# Patient Record
Sex: Male | Born: 1950 | Race: White | Hispanic: No | Marital: Married | Smoking: Former smoker
Health system: Southern US, Community
[De-identification: ages and names within clinical notes are randomized; demographics above are authoritative.]

## PROBLEM LIST (undated history)

## (undated) DIAGNOSIS — G709 Myoneural disorder, unspecified: Secondary | ICD-10-CM

## (undated) DIAGNOSIS — M2042 Other hammer toe(s) (acquired), left foot: Secondary | ICD-10-CM

## (undated) DIAGNOSIS — E11319 Type 2 diabetes mellitus with unspecified diabetic retinopathy without macular edema: Secondary | ICD-10-CM

## (undated) DIAGNOSIS — I499 Cardiac arrhythmia, unspecified: Secondary | ICD-10-CM

## (undated) DIAGNOSIS — I10 Essential (primary) hypertension: Secondary | ICD-10-CM

## (undated) DIAGNOSIS — E785 Hyperlipidemia, unspecified: Secondary | ICD-10-CM

## (undated) DIAGNOSIS — M1990 Unspecified osteoarthritis, unspecified site: Secondary | ICD-10-CM

## (undated) DIAGNOSIS — M1460 Charcot's joint, unspecified site: Secondary | ICD-10-CM

## (undated) HISTORY — DX: Hyperlipidemia, unspecified: E78.5

## (undated) HISTORY — PX: TONSILLECTOMY AND ADENOIDECTOMY: SUR1326

## (undated) HISTORY — PX: TOE AMPUTATION: SHX809

## (undated) HISTORY — PX: VASECTOMY: SHX75

## (undated) HISTORY — DX: Essential (primary) hypertension: I10

---

## 1993-03-31 DIAGNOSIS — I10 Essential (primary) hypertension: Secondary | ICD-10-CM

## 1993-03-31 HISTORY — DX: Essential (primary) hypertension: I10

## 1997-03-31 HISTORY — PX: DOPPLER ECHOCARDIOGRAPHY: SHX263

## 1997-05-29 DIAGNOSIS — E785 Hyperlipidemia, unspecified: Secondary | ICD-10-CM

## 1997-05-29 HISTORY — DX: Hyperlipidemia, unspecified: E78.5

## 1997-06-20 ENCOUNTER — Encounter: Payer: Self-pay | Admitting: Family Medicine

## 1997-06-20 LAB — CONVERTED CEMR LAB
RBC count: 5.22 10*6/uL
TSH: 0.61 microintl units/mL
WBC, blood: 5.7 10*3/uL

## 1997-11-29 ENCOUNTER — Encounter: Payer: Self-pay | Admitting: Family Medicine

## 1997-12-08 ENCOUNTER — Encounter: Payer: Self-pay | Admitting: Family Medicine

## 1997-12-08 LAB — CONVERTED CEMR LAB
Blood Glucose, Fasting: 126 mg/dL
Hgb A1c MFr Bld: 6 %

## 1997-12-29 HISTORY — PX: OTHER SURGICAL HISTORY: SHX169

## 1998-05-30 HISTORY — PX: OTHER SURGICAL HISTORY: SHX169

## 1998-06-30 HISTORY — PX: OTHER SURGICAL HISTORY: SHX169

## 1999-01-30 ENCOUNTER — Encounter: Payer: Self-pay | Admitting: Family Medicine

## 1999-01-30 LAB — CONVERTED CEMR LAB: Hgb A1c MFr Bld: 10.9 %

## 1999-02-07 ENCOUNTER — Encounter: Payer: Self-pay | Admitting: Family Medicine

## 1999-05-24 ENCOUNTER — Encounter: Payer: Self-pay | Admitting: Family Medicine

## 1999-05-24 LAB — CONVERTED CEMR LAB: Blood Glucose, Fasting: 291 mg/dL

## 1999-09-29 ENCOUNTER — Encounter: Payer: Self-pay | Admitting: Family Medicine

## 1999-09-29 LAB — CONVERTED CEMR LAB: Hgb A1c MFr Bld: 7.5 %

## 1999-09-30 ENCOUNTER — Encounter: Payer: Self-pay | Admitting: Family Medicine

## 2000-03-31 ENCOUNTER — Encounter: Payer: Self-pay | Admitting: Family Medicine

## 2000-04-01 ENCOUNTER — Encounter: Payer: Self-pay | Admitting: Family Medicine

## 2000-08-29 ENCOUNTER — Encounter: Payer: Self-pay | Admitting: Family Medicine

## 2000-08-29 LAB — CONVERTED CEMR LAB: Hgb A1c MFr Bld: 6.9 %

## 2000-09-22 ENCOUNTER — Encounter: Payer: Self-pay | Admitting: Family Medicine

## 2000-09-22 LAB — CONVERTED CEMR LAB
Blood Glucose, Fasting: 165 mg/dL
Microalbumin U total vol: 9.3 mg/L

## 2001-01-11 HISTORY — PX: DOPPLER ECHOCARDIOGRAPHY: SHX263

## 2001-05-29 ENCOUNTER — Encounter: Payer: Self-pay | Admitting: Family Medicine

## 2001-05-29 LAB — CONVERTED CEMR LAB: Microalbumin U total vol: 12.9 mg/L

## 2001-06-01 ENCOUNTER — Encounter: Payer: Self-pay | Admitting: Family Medicine

## 2001-06-01 LAB — CONVERTED CEMR LAB
Hgb A1c MFr Bld: 7.6 %
PSA: 0.2 ng/mL
TSH: 0.75 microintl units/mL

## 2002-03-02 HISTORY — PX: OTHER SURGICAL HISTORY: SHX169

## 2002-03-31 ENCOUNTER — Encounter: Payer: Self-pay | Admitting: Family Medicine

## 2002-03-31 LAB — CONVERTED CEMR LAB: Hgb A1c MFr Bld: 6.4 %

## 2002-04-11 ENCOUNTER — Encounter: Payer: Self-pay | Admitting: Family Medicine

## 2002-04-11 LAB — CONVERTED CEMR LAB
Blood Glucose, Fasting: 161 mg/dL
Hgb A1c MFr Bld: 6.4 %

## 2002-06-20 ENCOUNTER — Encounter: Payer: Self-pay | Admitting: Family Medicine

## 2002-06-20 LAB — CONVERTED CEMR LAB
Hgb A1c MFr Bld: 7.3 %
Microalbumin U total vol: 4.7 mg/L
PSA: 0.2 ng/mL

## 2002-07-01 LAB — FECAL OCCULT BLOOD, GUAIAC: Fecal Occult Blood: NEGATIVE

## 2003-04-01 HISTORY — PX: ATRIAL ABLATION SURGERY: SHX560

## 2004-04-19 ENCOUNTER — Ambulatory Visit: Payer: Self-pay | Admitting: Family Medicine

## 2005-07-23 ENCOUNTER — Ambulatory Visit: Payer: Self-pay | Admitting: Gastroenterology

## 2005-08-05 ENCOUNTER — Encounter (INDEPENDENT_AMBULATORY_CARE_PROVIDER_SITE_OTHER): Payer: Self-pay | Admitting: *Deleted

## 2005-08-05 ENCOUNTER — Ambulatory Visit: Payer: Self-pay | Admitting: Gastroenterology

## 2005-08-05 LAB — HM COLONOSCOPY

## 2005-09-28 ENCOUNTER — Encounter: Payer: Self-pay | Admitting: Family Medicine

## 2005-09-28 LAB — CONVERTED CEMR LAB: Hgb A1c MFr Bld: 8.2 %

## 2005-11-26 ENCOUNTER — Encounter: Payer: Self-pay | Admitting: Family Medicine

## 2005-11-26 HISTORY — PX: DOBUTAMINE STRESS ECHO: SHX5426

## 2005-12-29 ENCOUNTER — Encounter: Payer: Self-pay | Admitting: Family Medicine

## 2005-12-29 LAB — CONVERTED CEMR LAB: Hgb A1c MFr Bld: 7.8 %

## 2006-03-31 ENCOUNTER — Encounter: Payer: Self-pay | Admitting: Family Medicine

## 2006-03-31 LAB — CONVERTED CEMR LAB: Hgb A1c MFr Bld: 8.8 %

## 2006-06-30 ENCOUNTER — Encounter: Payer: Self-pay | Admitting: Family Medicine

## 2007-03-03 ENCOUNTER — Ambulatory Visit: Payer: Self-pay | Admitting: Family Medicine

## 2007-03-05 ENCOUNTER — Encounter: Payer: Self-pay | Admitting: Family Medicine

## 2007-03-05 DIAGNOSIS — I4891 Unspecified atrial fibrillation: Secondary | ICD-10-CM | POA: Insufficient documentation

## 2007-03-08 DIAGNOSIS — E785 Hyperlipidemia, unspecified: Secondary | ICD-10-CM | POA: Insufficient documentation

## 2007-03-08 DIAGNOSIS — I10 Essential (primary) hypertension: Secondary | ICD-10-CM | POA: Insufficient documentation

## 2007-03-08 DIAGNOSIS — E119 Type 2 diabetes mellitus without complications: Secondary | ICD-10-CM | POA: Insufficient documentation

## 2007-04-17 ENCOUNTER — Encounter: Payer: Self-pay | Admitting: Family Medicine

## 2007-06-09 ENCOUNTER — Ambulatory Visit: Payer: Self-pay | Admitting: Family Medicine

## 2007-06-15 ENCOUNTER — Ambulatory Visit: Payer: Self-pay | Admitting: Family Medicine

## 2007-06-15 DIAGNOSIS — M25569 Pain in unspecified knee: Secondary | ICD-10-CM | POA: Insufficient documentation

## 2007-06-23 ENCOUNTER — Encounter: Payer: Self-pay | Admitting: Family Medicine

## 2007-07-07 ENCOUNTER — Ambulatory Visit: Payer: Self-pay | Admitting: Family Medicine

## 2007-07-07 LAB — CONVERTED CEMR LAB
AST: 19 units/L (ref 0–37)
Alkaline Phosphatase: 60 units/L (ref 39–117)
Basophils Absolute: 0 10*3/uL (ref 0.0–0.1)
Bilirubin, Direct: 0.1 mg/dL (ref 0.0–0.3)
CO2: 29 meq/L (ref 19–32)
Chloride: 107 meq/L (ref 96–112)
Creatinine,U: 110.4 mg/dL
Direct LDL: 82.1 mg/dL
Eosinophils Absolute: 0.2 10*3/uL (ref 0.0–0.7)
GFR calc non Af Amer: 124 mL/min
HDL: 25.7 mg/dL — ABNORMAL LOW (ref >39.0)
Hgb A1c MFr Bld: 9.5 % — ABNORMAL HIGH (ref 4.6–6.0)
Lymphocytes Relative: 40.9 % (ref 12.0–46.0)
MCHC: 34 g/dL (ref 30.0–36.0)
MCV: 84.9 fL (ref 78.0–100.0)
Neutrophils Relative %: 48.8 % (ref 43.0–77.0)
PSA: 0.18 ng/mL (ref 0.10–4.00)
Platelets: 190 10*3/uL (ref 150–400)
Potassium: 4.1 meq/L (ref 3.5–5.1)
TSH: 0.51 microintl units/mL (ref 0.35–5.50)
Total Bilirubin: 0.9 mg/dL (ref 0.3–1.2)
Triglycerides: 201 mg/dL (ref 0–149)
VLDL: 40 mg/dL (ref 0–40)
WBC: 5.4 10*3/uL (ref 4.5–10.5)

## 2007-07-14 ENCOUNTER — Ambulatory Visit: Payer: Self-pay | Admitting: Family Medicine

## 2007-10-13 ENCOUNTER — Ambulatory Visit: Payer: Self-pay | Admitting: Family Medicine

## 2007-10-13 LAB — CONVERTED CEMR LAB: Hgb A1c MFr Bld: 9.6 % — ABNORMAL HIGH (ref 4.6–6.0)

## 2007-10-27 ENCOUNTER — Ambulatory Visit: Payer: Self-pay | Admitting: Family Medicine

## 2007-10-27 DIAGNOSIS — F528 Other sexual dysfunction not due to a substance or known physiological condition: Secondary | ICD-10-CM | POA: Insufficient documentation

## 2007-11-24 ENCOUNTER — Ambulatory Visit: Payer: Self-pay | Admitting: Family Medicine

## 2007-12-01 ENCOUNTER — Ambulatory Visit: Payer: Self-pay | Admitting: Family Medicine

## 2008-01-17 ENCOUNTER — Telehealth: Payer: Self-pay | Admitting: Family Medicine

## 2009-01-24 ENCOUNTER — Ambulatory Visit: Payer: Self-pay | Admitting: Family Medicine

## 2009-01-24 LAB — CONVERTED CEMR LAB
ALT: 19 units/L (ref 0–53)
BUN: 11 mg/dL (ref 6–23)
Basophils Absolute: 0 10*3/uL (ref 0.0–0.1)
Calcium: 8.8 mg/dL (ref 8.4–10.5)
Chloride: 100 meq/L (ref 96–112)
Cholesterol: 168 mg/dL (ref 0–200)
Creatinine, Ser: 0.7 mg/dL (ref 0.4–1.5)
HCT: 44 % (ref 39.0–52.0)
Lymphs Abs: 2.7 10*3/uL (ref 0.7–4.0)
MCV: 89.1 fL (ref 78.0–100.0)
Monocytes Absolute: 0.5 10*3/uL (ref 0.1–1.0)
Monocytes Relative: 7.2 % (ref 3.0–12.0)
PSA: 0.28 ng/mL (ref 0.10–4.00)
Platelets: 176 10*3/uL (ref 150.0–400.0)
RDW: 13.6 % (ref 11.5–14.6)
Total Bilirubin: 1.1 mg/dL (ref 0.3–1.2)
Total CHOL/HDL Ratio: 6
Triglycerides: 281 mg/dL — ABNORMAL HIGH (ref 0.0–149.0)

## 2009-01-31 ENCOUNTER — Ambulatory Visit: Payer: Self-pay | Admitting: Family Medicine

## 2009-01-31 DIAGNOSIS — E559 Vitamin D deficiency, unspecified: Secondary | ICD-10-CM | POA: Insufficient documentation

## 2009-11-01 ENCOUNTER — Encounter (INDEPENDENT_AMBULATORY_CARE_PROVIDER_SITE_OTHER): Payer: Self-pay | Admitting: *Deleted

## 2010-01-25 ENCOUNTER — Ambulatory Visit: Payer: Self-pay | Admitting: Family Medicine

## 2010-01-25 DIAGNOSIS — Q742 Other congenital malformations of lower limb(s), including pelvic girdle: Secondary | ICD-10-CM | POA: Insufficient documentation

## 2010-01-25 DIAGNOSIS — L0390 Cellulitis, unspecified: Secondary | ICD-10-CM

## 2010-01-25 DIAGNOSIS — L0291 Cutaneous abscess, unspecified: Secondary | ICD-10-CM | POA: Insufficient documentation

## 2010-01-28 ENCOUNTER — Telehealth: Payer: Self-pay | Admitting: Family Medicine

## 2010-02-08 ENCOUNTER — Ambulatory Visit: Payer: Self-pay | Admitting: Family Medicine

## 2010-02-08 DIAGNOSIS — IMO0002 Reserved for concepts with insufficient information to code with codable children: Secondary | ICD-10-CM | POA: Insufficient documentation

## 2010-02-20 ENCOUNTER — Telehealth (INDEPENDENT_AMBULATORY_CARE_PROVIDER_SITE_OTHER): Payer: Self-pay | Admitting: *Deleted

## 2010-02-27 ENCOUNTER — Ambulatory Visit: Payer: Self-pay | Admitting: Family Medicine

## 2010-02-27 LAB — CONVERTED CEMR LAB
ALT: 15 units/L (ref 0–53)
Alkaline Phosphatase: 61 units/L (ref 39–117)
Bilirubin, Direct: 0.1 mg/dL (ref 0.0–0.3)
CO2: 28 meq/L (ref 19–32)
Chloride: 101 meq/L (ref 96–112)
Creatinine,U: 99.1 mg/dL
Hgb A1c MFr Bld: 10.8 % — ABNORMAL HIGH (ref 4.6–6.5)
Microalb Creat Ratio: 1.6 mg/g (ref 0.0–30.0)
Potassium: 4.3 meq/L (ref 3.5–5.1)
Sodium: 138 meq/L (ref 135–145)
TSH: 1.21 microintl units/mL (ref 0.35–5.50)
Total CHOL/HDL Ratio: 5
Total Protein: 6.3 g/dL (ref 6.0–8.3)

## 2010-03-01 ENCOUNTER — Ambulatory Visit: Payer: Self-pay | Admitting: Family Medicine

## 2010-04-30 NOTE — Assessment & Plan Note (Signed)
Summary: hole from abcess   Vital Signs:  Patient profile:   60 year old male Height:      73.5 inches Weight:      233.75 pounds BMI:     30.53 Temp:     98 degrees F oral Pulse rate:   100 / minute Pulse rhythm:   regular BP sitting:   168 / 74  (left arm) Cuff size:   large  Vitals Entered By: Delilah Shan CMA Duncan Dull) (February 08, 2010 3:01 PM) CC: Hole from abscess that drained (left armpit)   History of Present Illness: Prev absess drained, now with residual hole on L axilla.  Occ drainage.  has been washing it daily and filling it with neosporin.  No fevers.  minimal erythema per patient.   Allergies: 1)  ! Actos (Pioglitazone Hcl) 2)  ! Avandia (Rosiglitazone Maleate)  Review of Systems       See HPI.  Otherwise negative.    Physical Exam  General:  NAD 1cm hole that probes 2cm deep.  minimal erythema. no other fluctuant mass.  packed with iodoform gauze and tolerated well.    Impression & Recommendations:  Problem # 1:  ABSCESS, AXILLA, LEFT (ICD-682.3) Packed today and see instructions.  follow up as needed . he agrees.  His updated medication list for this problem includes:    Doxycycline Hyclate 100 Mg Caps (Doxycycline hyclate) .Marland Kitchen... 1 by mouth two times a day  Complete Medication List: 1)  Metformin Hcl 1000 Mg Tabs (Metformin hcl) .Marland Kitchen.. 1 tablet by mouth twice a day 2)  Coreg 12.5 Mg Tabs (Carvedilol) .Marland Kitchen.. 1tablet by mouth twice a day 3)  Glyburide 5 Mg Tabs (Glyburide) .... Take 2 by mouth two times a day 4)  Doxycycline Hyclate 100 Mg Caps (Doxycycline hyclate) .Marland Kitchen.. 1 by mouth two times a day 5)  Humulin N 100 Unit/ml Susp (Insulin isophane human) .Marland Kitchen.. 10 units qpm 6)  Humulin R 100 Unit/ml Soln (Insulin regular human) .Marland Kitchen.. 15 units qpm  Patient Instructions: 1)  I would pull about 1/4" of the packing each day and trim it.  Leave a tail to work with.  It should gradually heal from the bottom up and should keep draining in the meantime.  Let me know  if the redness gets worse or if it hurts more.  Don't wear deoderant.  Take care.    Orders Added: 1)  Est. Patient Level III [16109]    Current Allergies (reviewed today): ! ACTOS (PIOGLITAZONE HCL) ! AVANDIA (ROSIGLITAZONE MALEATE)

## 2010-04-30 NOTE — Progress Notes (Signed)
Summary: not any better  Phone Note Call from Patient Call back at Home Phone [phone]   Caller: Patient Call For: Dr. Para March Summary of Call: Pt was seen on friday for abscesses under left arm.  He was told to call back if these were not better, he says they are not, they are worse- draining. He is taking abx.  Please advise on what he should do. Initial call taken by: Lowella Petties CMA, AAMA,  January 28, 2010 3:46 PM  Follow-up for Phone Call        "They feel some better."  They got bigger on Sunday and then started draining.  Both are draining and smaller now. No fevers, no spreading erythema.  I talked w/pt about options.  I would cont warm compresses for now and let us know if size increases.  I would cont the antibiotics.  follow up as needed.  he doesn't sound like he needs recheck now. He agrees with the plan.  Follow-up by: Crawford Givens MD,  January 28, 2010 4:52 PM

## 2010-04-30 NOTE — Progress Notes (Signed)
----   Converted from flag ---- ---- 02/19/2010 2:00 PM, Crawford Givens MD wrote: TSH 427.31 cmet/lipid/a1c/malb 250.00 vit d 268.9 psa v76.44  ---- 02/19/2010 1:29 PM, Liane Comber CMA (AAMA) wrote: Lab orders please! Good Morning! This pt is scheduled for cpx labs Wed, which labs to draw and dx codes to use? Thanks Tasha ------------------------------

## 2010-04-30 NOTE — Assessment & Plan Note (Signed)
Summary: ?RASH UNDER ARMS/CLE   Vital Signs:  Patient profile:   60 year old male Height:      73.5 inches Weight:      233.50 pounds BMI:     30.50 Temp:     98.4 degrees F oral Pulse rate:   96 / minute Pulse rhythm:   regular BP sitting:   160 / 90  (right arm) Cuff size:   large  Vitals Entered By: Delilah Shan CMA Duncan Dull) (January 25, 2010 4:01 PM) CC: Rash / ? infection under left arm.   Check toe.   History of Present Illness: Rash- 1 week ago, chapped under the L arm.  Since then increase in pain and "knots" under the L arm.  H/o similar in the distant past.  No fevers.  Fasting glucose  ~150 per patient.   R second toe- hammertoe, wearing a hammertoe pad.    H/o DM and h/o neuropathy.   Due for CPE and labs.    Allergies: 1)  ! Actos (Pioglitazone Hcl) 2)  ! Avandia (Rosiglitazone Maleate)  Past History:  Past Medical History: Last updated: 03/05/2007 Diabetes mellitus, type UX:(3244) Hyperlipidemia:(05/1997) Hypertension:((1995)  Past Surgical History: STRESS CARDIOLITE NORMAL:(12/1997) ECHO NORMAL EF 60%:(/1999) ETT OK:(05/1998) ETT NORMAL:(06/1998) COLONOSCOPY, DIVERTICULOSIS:(10/1999) ECHO ,MILD CMC, LVH, TR MR,TR:(ELAM):(01/11/2001) STRESS CARDIOLITE WNL EF 70%:(12/03/23003) COLONOSCOPY POLYPS DIVERTYICS (DR STARK) (08/05/2005) HOSP [facility] R/O'D 11/18/2005 STRESS ECHO NONSUST VTACH HTSVE ECG NML 11/26/05 Ablation for AFib 2005  Family History: Reviewed history from 01/31/2009 and no changes required. Father: DECEASED 40 STROKE , LEUKEMIA Mother: ALIVE BROTHER A 25 SISTER A   SISTER A SISTER dec  COLON CANCER CV:+GF MI X MULTIPLE, AUNT MI HBP: + SELF DM: + SELF MGM, MOTHERS SIBLINGS GOUT/ARTHRITIS: PROSTATE CANCER NEGATIVE// + LEUKEMIA FATHER BREAST/OVARIAN/UTERINE CANCER: COLON CANCER: + SISTER DEPRESSION: NEGATIVE ETOH/DRUG ABUSE : NEGATIVE OTHER + STROKE GF90'S  Social History: Marital Status: Married LIVES WITH WIFE Children: 4, out  of home Occupation: Programmer, applications, sells [employer] father in law lives with patient  Review of Systems       See HPI.  Otherwise negative.    Physical Exam  General:  NAD L axilla with thickening of the skin in 2 areas that is mildly tender to palpation.  No fluctuant mass.  No discharge.  No epithelial disruption.   R 2nd hammertoe noted. See foot exam.  2+ DP/PT pulses bilaterally  Diabetes Management Exam:    Foot Exam (with socks and/or shoes not present):       Sensory-Pinprick/Light touch:          Left medial foot (L-4): diminished          Left dorsal foot (L-5): normal          Left lateral foot (S-1): diminished          Right medial foot (L-4): diminished          Right dorsal foot (L-5): normal          Right lateral foot (S-1): diminished       Sensory-Monofilament:          Left foot: normal          Right foot: normal       Sensory-other: mild decrease in sensation on bottom of foot bilaterally        Inspection:          Left foot: normal          Right foot: abnormal  Comments: 2nd toe= hammertoe and irritation but w/o ulceration.  the nail had grown down on the front of the toe, but this has been trimmed back.        Nails:          Left foot: normal          Right foot: see above    Impression & Recommendations:  Problem # 1:  CELLULITIS (ICD-682.9) No indication for I&D.  Start antibiotics and avoid deoderant.  Nontoxic and okay for outpatient follow up.  follow up if not improving.  His updated medication list for this problem includes:    Doxycycline Hyclate 100 Mg Caps (Doxycycline hyclate) .Marland Kitchen... 1 by mouth two times a day  Problem # 2:  HAMMER TOE (ICD-755.66) Pt to call podiatry clinic for follow up.  No acute change that would need tx now.  D/w patient re:DM2 care and follow up.  He understands.   Complete Medication List: 1)  Metformin Hcl 1000 Mg Tabs (Metformin hcl) .Marland Kitchen.. 1 tablet by mouth twice a day 2)  Coreg 12.5 Mg Tabs  (Carvedilol) .Marland Kitchen.. 1tablet by mouth twice a day 3)  Glyburide 5 Mg Tabs (Glyburide) .... Take 2 by mouth two times a day 4)  Doxycycline Hyclate 100 Mg Caps (Doxycycline hyclate) .Marland Kitchen.. 1 by mouth two times a day 5)  Humulin N 100 Unit/ml Susp (Insulin isophane human) .Marland Kitchen.. 10 units qpm 6)  Humulin R 100 Unit/ml Soln (Insulin regular human) .Marland Kitchen.. 15 units qpm  Patient Instructions: 1)  Take the antibiotics two times a day and let me know if you aren't getting better.  Call the podiatry clinic and schedule an appointment with them about your toe. 2)  Come back for fasting labs.  3)  cmet/lipid/A1c/MALB 250.00 4)  PSA v76.49 5)  Get a physical set up for a few days after your labs.  Prescriptions: DOXYCYCLINE HYCLATE 100 MG CAPS (DOXYCYCLINE HYCLATE) 1 by mouth two times a day  #20 x 0   Entered and Authorized by:   Crawford Givens MD   Signed by:   Crawford Givens MD on 01/25/2010   Method used:   Electronically to        [facility] [phone]* (retail)       [address]       [place], [place]  [address]       Ph: [phone]       Fax: [phone]   RxID:   [id number]    Orders Added: 1)  Est. Patient Level III [24401]

## 2010-04-30 NOTE — Letter (Signed)
Summary: Nadara Eaton letter  [facility]  [address] [place], [place] [address]   Phone: [phone]  Fax: [phone]       11/01/2009 MRN: [id number]  Dillon Henry [address] [place], [place]  [address]  Dear Mr. [name] [place] [facility], and [facility] announce the retirement of Arta Silence, M.D., from full-time practice at the [facility] office effective September 27, 2009 and his plans of returning part-time.  It is important to Dr. Hetty Ely and to our practice that you understand that [facility] has seven physicians in our office for your health care needs.  We will continue to offer the same exceptional care that you have today.    Dr. Hetty Ely has spoken to many of you about his plans for retirement and returning part-time in the fall.   We will continue to work with you through the transition to schedule appointments for you in the office and meet the high standards that [place] is committed to.   Again, it is with great pleasure that we share the news that Dr. Hetty Ely will return to [facility] at [facility] in October of 2011 with a reduced schedule.    If you have any questions, or would like to request an appointment with one of our physicians, please call us at [phone] and press the option for Scheduling an appointment.  We take pleasure in providing you with excellent patient care and look forward to seeing you at your next office visit.  Our [facility] Physicians are:  Tillman Abide, M.D. Laurita Quint, M.D. Roxy Manns, M.D. Kerby Nora, M.D. Hannah Beat, M.D. Ruthe Mannan, M.D. We proudly welcomed Raechel Ache, M.D. and Eustaquio Boyden, M.D. to the practice in July/August 2011.  Sincerely,  [facility]

## 2010-04-30 NOTE — Assessment & Plan Note (Signed)
Summary: CPX/DLO   Vital Signs:  Patient profile:   60 year old male Height:      73.5 inches Weight:      233.75 pounds BMI:     30.53 Temp:     98.4 degrees F oral Pulse rate:   88 / minute Pulse rhythm:   regular BP sitting:   150 / 94  (left arm) Cuff size:   large  Vitals Entered By: Delilah Shan CMA Duncan Dull) (March 01, 2010 2:36 PM) CC: CPX   History of Present Illness: Here for CPE.  All items deferred due to DM2.   All labs d/w patient.    Eats 1 big meal a day, later in the day.  Snacks during the day.  Checking sugar rarely,  ~1/week.    Prev was on levemir but had trouble getting rx for enough insulin to carry 90days.    Feeling well and L axillary lesion healing.   Allergies: 1)  ! Actos (Pioglitazone Hcl) 2)  ! Avandia (Rosiglitazone Maleate)  Review of Systems       See HPI.  Otherwise negative.    Physical Exam  General:  NAD remainder deferred- see plan.    Impression & Recommendations:  Problem # 1:  DIABETES MELLITUS, TYPE II (ICD-250.00) >45 min spent with patient, at least half of which was spent on counseling re:dx and plan.  This was a long and detailed conversation about options.  He needs better control and we discussed long term problems from uncontrolled DM.  Will start levemir and titrate the AM dose based on sugars.  If A1c still elevated, will need short acting insulin with the evening meal.  Stop glyburide if low glucose later in the day.  He understands.  See instructions.  The following medications were removed from the medication list:    Humulin N 100 Unit/ml Susp (Insulin isophane human) .Marland KitchenMarland KitchenMarland KitchenMarland Kitchen 10 units each evening    Humulin R 100 Unit/ml Soln (Insulin regular human) .Marland KitchenMarland KitchenMarland KitchenMarland Kitchen 15 units each evening His updated medication list for this problem includes:    Metformin Hcl 1000 Mg Tabs (Metformin hcl) .Marland Kitchen... 1 tablet by mouth twice a day    Glyburide 5 Mg Tabs (Glyburide) .Marland Kitchen... Take 2 by mouth two times a day    Levemir 100 Unit/ml  Soln (Insulin detemir) .Marland Kitchen... 20 units injected each am. if am sugar >120, add 1 unit. if <100, subtract 1 unit, if 101-119 then no change in dose  Complete Medication List: 1)  Metformin Hcl 1000 Mg Tabs (Metformin hcl) .Marland Kitchen.. 1 tablet by mouth twice a day 2)  Coreg 12.5 Mg Tabs (Carvedilol) .Marland Kitchen.. 1tablet by mouth twice a day 3)  Glyburide 5 Mg Tabs (Glyburide) .... Take 2 by mouth two times a day 4)  Levemir 100 Unit/ml Soln (Insulin detemir) .... 20 units injected each am. if am sugar >120, add 1 unit. if <100, subtract 1 unit, if 101-119 then no change in dose  Patient Instructions: 1)  Get the levemir and use as follows: if AM sugar >120, increase by 1 unit.  If <100, decrease by 1 unit.  If 101-119, no change in dose.  Let me know how you are doing on the new medicine.  If you have lower sugars later in the day, decrease or stop the glyburide.  Take care.   2)  I want to recheck your A1c in 3-4 months.  250.00 Prescriptions: LEVEMIR 100 UNIT/ML SOLN (INSULIN DETEMIR) 20 units injected each AM.  if AM sugar >120, add 1 unit. if <100, subtract 1 unit, if 101-119 then no change in dose  #3 vials x 3   Entered and Authorized by:   Crawford Givens MD   Signed by:   Crawford Givens MD on 03/01/2010   Method used:   Faxed to ...       [facility] (mail-order)             , [place]         Ph: [phone]       Fax: [phone]   RxID:   [id number]    Orders Added: 1)  Est. Patient Level V [84166]    Current Allergies (reviewed today): ! ACTOS (PIOGLITAZONE HCL) ! AVANDIA (ROSIGLITAZONE MALEATE)

## 2010-05-08 ENCOUNTER — Telehealth: Payer: Self-pay | Admitting: Family Medicine

## 2010-05-16 NOTE — Progress Notes (Signed)
Summary: [facility] Rx's  Phone Note Call from Patient Call back at Home Phone [phone]   Caller: Patient Call For: Dillon Givens MD Summary of Call: Patient says you put him on Levemir and instructed him on how to increase his dosage as needed.  A Rx. was sent to [facility] for him but with his necessary increases, he is about to run out and it has only been 2 months.  He is now taking 70 units a day.  He would like a new Rx. sent to [facility] for a 90 day supply at 70 units per day.  He also says [facility] has been soliciting him for Rx's and he wants his Metformin, Glyburide and Carvedilol sent there also with 90 day supplies and RF's. Initial call taken by: Delilah Shan CMA Duncan Dull),  May 08, 2010 5:39 PM  Follow-up for Phone Call        I updated the insulin dose and faxed them all.  thanks. Dillon Givens MD  May 09, 2010 11:37 AM.  Follow-up by: Dillon Givens MD,  May 09, 2010 11:37 AM    New/Updated Medications: LEVEMIR 100 UNIT/ML SOLN (INSULIN DETEMIR) 70 units injected each AM. if AM sugar >120, add 1 unit. if <100, subtract 1 unit, if 101-119 then no change in dose Prescriptions: GLYBURIDE 5 MG TABS (GLYBURIDE) Take 2 by mouth two times a day  #360 Each x 3   Entered and Authorized by:   Dillon Givens MD   Signed by:   Dillon Givens MD on 05/09/2010   Method used:   Faxed to ...       [facility] (mail-order)             , [place]         Ph: [phone]       Fax: [phone]   RxID:   [id number] COREG 12.5 MG  TABS (CARVEDILOL) 1TABLET BY MOUTH TWICE A DAY  #180 Each x 3   Entered and Authorized by:   Dillon Givens MD   Signed by:   Dillon Givens MD on 05/09/2010   Method used:   Faxed to ...       [facility] (mail-order)             , [place]         Ph: [phone]       Fax: [phone]   RxID:   [id number] METFORMIN HCL 1000 MG  TABS (METFORMIN HCL) 1 TABLET BY MOUTH TWICE A DAY  #180 Each x 3   Entered and Authorized by:   Dillon Givens MD   Signed by:   Dillon Givens MD on 05/09/2010   Method used:   Faxed to ...       [facility] (mail-order)             , [place]         Ph: [phone]       Fax: [phone]   RxID:   [id number] LEVEMIR 100 UNIT/ML SOLN (INSULIN DETEMIR) 70 units injected each AM. if AM sugar >120, add 1 unit. if <100, subtract 1 unit, if 101-119 then no change in dose  #8 vials x 3   Entered and Authorized by:   Dillon Givens MD   Signed by:   Dillon Givens MD on 05/09/2010   Method used:   Faxed to ...       [facility] (mail-order)             ,  [place] Henry         Ph: [phone]       Fax: [phone]   RxID:   [id number] LEVEMIR 100 UNIT/ML SOLN (INSULIN DETEMIR) 20 units injected each AM. if AM sugar >120, add 1 unit. if <100, subtract 1 unit, if 101-119 then no change in dose  #8 vials x 3   Entered and Authorized by:   Dillon Givens MD   Signed by:   Dillon Givens MD on 05/09/2010   Method used:   Historical   RxID:   [id number] GLYBURIDE 5 MG TABS (GLYBURIDE) Take 2 by mouth two times a day  #360 Each x 3   Entered and Authorized by:   Dillon Givens MD   Signed by:   Dillon Givens MD on 05/09/2010   Method used:   Historical   RxID:   [id number] COREG 12.5 MG  TABS (CARVEDILOL) 1TABLET BY MOUTH TWICE A DAY  #180 Each x 3   Entered and Authorized by:   Dillon Givens MD   Signed by:   Dillon Givens MD on 05/09/2010   Method used:   Historical   RxID:   [id number] METFORMIN HCL 1000 MG  TABS (METFORMIN HCL) 1 TABLET BY MOUTH TWICE A DAY  #180 Each x 3   Entered and Authorized by:   Dillon Givens MD   Signed by:   Dillon Givens MD on 05/09/2010   Method used:   Historical   RxID:   [id number]

## 2010-11-19 ENCOUNTER — Encounter: Payer: Self-pay | Admitting: Family Medicine

## 2010-11-20 ENCOUNTER — Ambulatory Visit: Payer: Self-pay | Admitting: Family Medicine

## 2010-11-21 ENCOUNTER — Ambulatory Visit (INDEPENDENT_AMBULATORY_CARE_PROVIDER_SITE_OTHER): Payer: BC Managed Care – PPO | Admitting: Family Medicine

## 2010-11-21 ENCOUNTER — Encounter: Payer: Self-pay | Admitting: Family Medicine

## 2010-11-21 DIAGNOSIS — I4891 Unspecified atrial fibrillation: Secondary | ICD-10-CM

## 2010-11-21 DIAGNOSIS — E119 Type 2 diabetes mellitus without complications: Secondary | ICD-10-CM

## 2010-11-21 DIAGNOSIS — Q742 Other congenital malformations of lower limb(s), including pelvic girdle: Secondary | ICD-10-CM

## 2010-11-21 NOTE — Patient Instructions (Addendum)
We'll contact you with your lab report and I'll send my note to the foot clinic.  I'll need to talk to cardiology in the meantime.  Take care.

## 2010-11-21 NOTE — Progress Notes (Signed)
"  Surgical clearance".  R foot surgery planned for 11/28/10.  Dillon Henry, [facility] [phone].  R 2nd hammertoe  H/o DM2.  Last seen late 2012.  No f/u since then.  He couldn't get strips recently and hasn't been able to check sugar for a month.  Last A1c was >10.  H/o AF s/p ablation.  No CP, sob, occ minimal BLE edema at night (resolved by AM).  No H/o MI.  No angioplasty, no stent and no CABG.    Can walk 2-3 miles a day w/o CP/SOB, can climb stairs w/o SOB/BLE.  The foot pain is limiting his mobility, not any known cardio/pulmonary process.  No tachy/palpitations.    PMH and SH reviewed  ROS: See HPI, otherwise noncontributory.  Meds, vitals, and allergies reviewed.   GEN: nad, alert and oriented HEENT: mucous membranes moist NECK: supple w/o LA CV: rrr.  PULM: ctab, no inc wob ABD: soft, +bs EXT: no edema SKIN: no acute rash but R second toe has chronic irritation/callus noted.   Diabetic foot exam: Normal inspection No skin breakdown Callus on R 2nd toe note Normal DP pulses Normal sensation to light touch

## 2010-11-22 ENCOUNTER — Encounter: Payer: Self-pay | Admitting: Family Medicine

## 2010-11-22 ENCOUNTER — Telehealth: Payer: Self-pay | Admitting: Family Medicine

## 2010-11-22 LAB — COMPREHENSIVE METABOLIC PANEL
Albumin: 4.2 g/dL (ref 3.5–5.2)
Alkaline Phosphatase: 61 U/L (ref 39–117)
BUN: 12 mg/dL (ref 6–23)
Calcium: 9 mg/dL (ref 8.4–10.5)
Chloride: 100 mEq/L (ref 96–112)
Glucose, Bld: 232 mg/dL — ABNORMAL HIGH (ref 70–99)
Potassium: 4.5 mEq/L (ref 3.5–5.1)

## 2010-11-22 LAB — CBC WITH DIFFERENTIAL/PLATELET
Basophils Relative: 0.4 % (ref 0.0–3.0)
Eosinophils Relative: 2.2 % (ref 0.0–5.0)
Hemoglobin: 14.4 g/dL (ref 13.0–17.0)
MCV: 88.7 fl (ref 78.0–100.0)
Monocytes Absolute: 0.5 10*3/uL (ref 0.1–1.0)
Neutrophils Relative %: 51.8 % (ref 43.0–77.0)
RBC: 4.8 Mil/uL (ref 4.22–5.81)
WBC: 7.7 10*3/uL (ref 4.5–10.5)

## 2010-11-22 LAB — HEMOGLOBIN A1C: Hgb A1c MFr Bld: 10 % — ABNORMAL HIGH (ref 4.6–6.5)

## 2010-11-22 NOTE — Assessment & Plan Note (Signed)
Prev uncontrolled, needs A1c.  See above.  >25 min spent with face to face with patient, >50% counseling.  He is aware of long-term complications of uncontrolled DM2.

## 2010-11-22 NOTE — Telephone Encounter (Signed)
LMOVM at patient's home phone number (permission granted).  Phoned and Faxed note to Dr. Orland Jarred, Podiatry.  Spoke with Debbie.

## 2010-11-22 NOTE — Telephone Encounter (Signed)
Please call pt.  I have reviewed his EKG and it is okay.  However, A1c is still greatly elevated.  He has uncontrolled DM and I cannot recommend him having surgery until this is controlled.  He needs to get blood sugar checked before meals and then 2 hours after meals (6 checks a day) for several days and then send the readings into the clinic for me to review.    Please notify foot clinic that in my opinion pt isn't appropriate for surgery.  See hard copy for contact info.   Thanks.

## 2010-11-22 NOTE — Assessment & Plan Note (Signed)
I told him that I can't "clear" him for surgery, but I can assess if he is acceptably low risk for surgery.  At this point, I need more information.  I'll notify pt when more data are available.

## 2010-11-22 NOTE — Assessment & Plan Note (Signed)
S/p ablation.  EKG reviewed, but I'll need to talk with cards before he proceeds with surgery.

## 2010-11-29 ENCOUNTER — Telehealth: Payer: Self-pay | Admitting: *Deleted

## 2010-11-29 DIAGNOSIS — E119 Type 2 diabetes mellitus without complications: Secondary | ICD-10-CM

## 2010-11-29 NOTE — Telephone Encounter (Signed)
Patient brought in some Blood sugar readings for review.  List in your in box.

## 2010-11-29 NOTE — Telephone Encounter (Signed)
Patient advised.

## 2010-11-29 NOTE — Telephone Encounter (Signed)
Instructions given to patient.  Lab appt and CPE scheduled in 3 months.  Patient is asking if you would fill out his paper for the surgery now?

## 2010-11-29 NOTE — Telephone Encounter (Signed)
LMOVM to return call for instructions and making appts.

## 2010-11-29 NOTE — Telephone Encounter (Signed)
Call pt.  His sugar levels as documented are okay for now.  I would titrate as follows:  If morning sugar is >120, add 1 unit to that days doses. If <100, subtract 1 unit, if 101-119 then no change in dose. Recheck A1c in 3 months with OV a few days later.   I don't know why his A1c was so high (I asked lab and the calibration has been consistent).  It could have been missed doses, diet changes.  Either way, I would proceed as is and see if the A1c drifted downward to a stable level.

## 2010-11-29 NOTE — Telephone Encounter (Signed)
No, I need evidence of sustained blood sugar control before I can fill it out.

## 2010-12-19 ENCOUNTER — Ambulatory Visit: Payer: Self-pay | Admitting: Podiatry

## 2011-02-17 ENCOUNTER — Other Ambulatory Visit: Payer: BC Managed Care – PPO

## 2011-02-24 ENCOUNTER — Other Ambulatory Visit: Payer: Self-pay | Admitting: Gastroenterology

## 2011-03-04 ENCOUNTER — Encounter: Payer: BC Managed Care – PPO | Admitting: Family Medicine

## 2011-03-10 ENCOUNTER — Ambulatory Visit: Payer: Self-pay | Admitting: Gastroenterology

## 2011-03-13 LAB — PATHOLOGY REPORT

## 2015-01-31 ENCOUNTER — Other Ambulatory Visit
Admission: RE | Admit: 2015-01-31 | Discharge: 2015-01-31 | Disposition: A | Discharge status: Home / Self Care | Payer: BLUE CROSS/BLUE SHIELD | Source: Ambulatory Visit | Attending: Podiatry | Admitting: Podiatry

## 2015-01-31 DIAGNOSIS — L0390 Cellulitis, unspecified: Secondary | ICD-10-CM | POA: Diagnosis present

## 2015-02-02 LAB — ANAEROBIC CULTURE

## 2015-02-02 LAB — WOUND CULTURE

## 2016-07-25 ENCOUNTER — Encounter (INDEPENDENT_AMBULATORY_CARE_PROVIDER_SITE_OTHER): Payer: Self-pay | Admitting: Ophthalmology

## 2016-07-30 ENCOUNTER — Encounter (INDEPENDENT_AMBULATORY_CARE_PROVIDER_SITE_OTHER): Payer: BLUE CROSS/BLUE SHIELD | Admitting: Ophthalmology

## 2016-07-30 DIAGNOSIS — E113512 Type 2 diabetes mellitus with proliferative diabetic retinopathy with macular edema, left eye: Secondary | ICD-10-CM | POA: Diagnosis not present

## 2016-07-30 DIAGNOSIS — E11311 Type 2 diabetes mellitus with unspecified diabetic retinopathy with macular edema: Secondary | ICD-10-CM | POA: Diagnosis not present

## 2016-07-30 DIAGNOSIS — E113311 Type 2 diabetes mellitus with moderate nonproliferative diabetic retinopathy with macular edema, right eye: Secondary | ICD-10-CM

## 2016-07-30 DIAGNOSIS — H43813 Vitreous degeneration, bilateral: Secondary | ICD-10-CM

## 2016-07-30 DIAGNOSIS — H2513 Age-related nuclear cataract, bilateral: Secondary | ICD-10-CM

## 2016-07-30 DIAGNOSIS — I10 Essential (primary) hypertension: Secondary | ICD-10-CM

## 2016-07-30 DIAGNOSIS — H35033 Hypertensive retinopathy, bilateral: Secondary | ICD-10-CM

## 2016-08-13 ENCOUNTER — Other Ambulatory Visit (INDEPENDENT_AMBULATORY_CARE_PROVIDER_SITE_OTHER): Payer: BLUE CROSS/BLUE SHIELD | Admitting: Ophthalmology

## 2016-08-13 DIAGNOSIS — E113511 Type 2 diabetes mellitus with proliferative diabetic retinopathy with macular edema, right eye: Secondary | ICD-10-CM | POA: Diagnosis not present

## 2016-08-13 DIAGNOSIS — E11311 Type 2 diabetes mellitus with unspecified diabetic retinopathy with macular edema: Secondary | ICD-10-CM | POA: Diagnosis not present

## 2016-08-28 ENCOUNTER — Other Ambulatory Visit (INDEPENDENT_AMBULATORY_CARE_PROVIDER_SITE_OTHER): Payer: BLUE CROSS/BLUE SHIELD | Admitting: Ophthalmology

## 2016-08-28 DIAGNOSIS — E113512 Type 2 diabetes mellitus with proliferative diabetic retinopathy with macular edema, left eye: Secondary | ICD-10-CM

## 2016-08-28 DIAGNOSIS — E11311 Type 2 diabetes mellitus with unspecified diabetic retinopathy with macular edema: Secondary | ICD-10-CM | POA: Diagnosis not present

## 2016-12-23 ENCOUNTER — Ambulatory Visit
Admission: RE | Admit: 2016-12-23 | Discharge: 2016-12-23 | Disposition: A | Discharge status: Home / Self Care | Payer: BLUE CROSS/BLUE SHIELD | Source: Ambulatory Visit | Attending: Gastroenterology | Admitting: Gastroenterology

## 2016-12-23 ENCOUNTER — Ambulatory Visit: Payer: BLUE CROSS/BLUE SHIELD | Admitting: Anesthesiology

## 2016-12-23 ENCOUNTER — Encounter
Admission: RE | Disposition: A | Discharge status: Home / Self Care | Payer: Self-pay | Source: Ambulatory Visit | Attending: Gastroenterology

## 2016-12-23 ENCOUNTER — Encounter: Payer: Self-pay | Admitting: *Deleted

## 2016-12-23 DIAGNOSIS — Z7982 Long term (current) use of aspirin: Secondary | ICD-10-CM | POA: Diagnosis not present

## 2016-12-23 DIAGNOSIS — Z79899 Other long term (current) drug therapy: Secondary | ICD-10-CM | POA: Diagnosis not present

## 2016-12-23 DIAGNOSIS — E785 Hyperlipidemia, unspecified: Secondary | ICD-10-CM | POA: Diagnosis not present

## 2016-12-23 DIAGNOSIS — K5730 Diverticulosis of large intestine without perforation or abscess without bleeding: Secondary | ICD-10-CM | POA: Insufficient documentation

## 2016-12-23 DIAGNOSIS — Z800 Family history of malignant neoplasm of digestive organs: Secondary | ICD-10-CM | POA: Insufficient documentation

## 2016-12-23 DIAGNOSIS — I4891 Unspecified atrial fibrillation: Secondary | ICD-10-CM | POA: Diagnosis not present

## 2016-12-23 DIAGNOSIS — D124 Benign neoplasm of descending colon: Secondary | ICD-10-CM | POA: Insufficient documentation

## 2016-12-23 DIAGNOSIS — Z794 Long term (current) use of insulin: Secondary | ICD-10-CM | POA: Insufficient documentation

## 2016-12-23 DIAGNOSIS — Z86010 Personal history of colonic polyps: Secondary | ICD-10-CM | POA: Insufficient documentation

## 2016-12-23 DIAGNOSIS — I10 Essential (primary) hypertension: Secondary | ICD-10-CM | POA: Insufficient documentation

## 2016-12-23 DIAGNOSIS — Z888 Allergy status to other drugs, medicaments and biological substances status: Secondary | ICD-10-CM | POA: Diagnosis not present

## 2016-12-23 DIAGNOSIS — E119 Type 2 diabetes mellitus without complications: Secondary | ICD-10-CM | POA: Insufficient documentation

## 2016-12-23 DIAGNOSIS — Z87891 Personal history of nicotine dependence: Secondary | ICD-10-CM | POA: Diagnosis not present

## 2016-12-23 DIAGNOSIS — Z1211 Encounter for screening for malignant neoplasm of colon: Secondary | ICD-10-CM | POA: Diagnosis not present

## 2016-12-23 HISTORY — PX: COLONOSCOPY WITH PROPOFOL: SHX5780

## 2016-12-23 LAB — GLUCOSE, CAPILLARY: Glucose-Capillary: 190 mg/dL — ABNORMAL HIGH (ref 65–99)

## 2016-12-23 LAB — HM COLONOSCOPY

## 2016-12-23 SURGERY — COLONOSCOPY WITH PROPOFOL
Anesthesia: General

## 2016-12-23 MED ORDER — PROPOFOL 500 MG/50ML IV EMUL
INTRAVENOUS | Status: AC
  Filled 2016-12-23: qty 50

## 2016-12-23 MED ORDER — FENTANYL CITRATE (PF) 100 MCG/2ML IJ SOLN
INTRAMUSCULAR | Status: AC
  Filled 2016-12-23: qty 2

## 2016-12-23 MED ORDER — FENTANYL CITRATE (PF) 100 MCG/2ML IJ SOLN
INTRAMUSCULAR | Status: DC | PRN
Start: 2016-12-23 — End: 2016-12-23
  Administered 2016-12-23: 50 ug via INTRAVENOUS
  Administered 2016-12-23 (×2): 25 ug via INTRAVENOUS

## 2016-12-23 MED ORDER — MIDAZOLAM HCL 2 MG/2ML IJ SOLN
INTRAMUSCULAR | Status: AC
  Filled 2016-12-23: qty 2

## 2016-12-23 MED ORDER — SODIUM CHLORIDE 0.9 % IV SOLN
INTRAVENOUS | Status: DC
  Administered 2016-12-23: 1000 mL via INTRAVENOUS

## 2016-12-23 MED ORDER — SODIUM CHLORIDE 0.9 % IV SOLN: INTRAVENOUS | Status: DC

## 2016-12-23 MED ORDER — PROPOFOL 500 MG/50ML IV EMUL
INTRAVENOUS | Status: DC | PRN
  Administered 2016-12-23: 120 ug/kg/min via INTRAVENOUS

## 2016-12-23 MED ORDER — MIDAZOLAM HCL 2 MG/2ML IJ SOLN
INTRAMUSCULAR | Status: DC | PRN
  Administered 2016-12-23: 2 mg via INTRAVENOUS

## 2016-12-23 NOTE — Anesthesia Procedure Notes (Signed)
Performed by: COOK-MARTIN, Emelin Dascenzo Pre-anesthesia Checklist: Patient identified, Emergency Drugs available, Suction available, Patient being monitored and Timeout performed Patient Re-evaluated:Patient Re-evaluated prior to induction Oxygen Delivery Method: Nasal cannula Preoxygenation: Pre-oxygenation with 100% oxygen Induction Type: IV induction Placement Confirmation: CO2 detector and positive ETCO2       

## 2016-12-23 NOTE — Anesthesia Post-op Follow-up Note (Signed)
Anesthesia QCDR form completed.        

## 2016-12-23 NOTE — Anesthesia Postprocedure Evaluation (Signed)
Anesthesia Post Note  Patient: Emanual Lamountain Alcorta  Procedure(s) Performed: Procedure(s) (LRB): COLONOSCOPY WITH PROPOFOL (N/A)  Patient location during evaluation: Endoscopy Anesthesia Type: General Level of consciousness: awake and alert and oriented Pain management: pain level controlled Vital Signs Assessment: post-procedure vital signs reviewed and stable Respiratory status: spontaneous breathing, nonlabored ventilation and respiratory function stable Cardiovascular status: blood pressure returned to baseline and stable Postop Assessment: no signs of nausea or vomiting Anesthetic complications: no     Last Vitals:  Vitals:   12/23/16 0840 12/23/16 0850  BP: 113/71 132/67  Pulse: 65 60  Resp: 18 (!) 22  Temp:    SpO2: 100% 96%    Last Pain:  Vitals:   12/23/16 0820  TempSrc: Tympanic                 Rochel Privett

## 2016-12-23 NOTE — Transfer of Care (Signed)
Immediate Anesthesia Transfer of Care Note  Patient: Dillon Henry  Procedure(s) Performed: Procedure(s): COLONOSCOPY WITH PROPOFOL (N/A)  Patient Location: PACU  Anesthesia Type:General  Level of Consciousness: awake and sedated  Airway & Oxygen Therapy: Patient Spontanous Breathing and Patient connected to nasal cannula oxygen  Post-op Assessment: Report given to RN and Post -op Vital signs reviewed and stable  Post vital signs: Reviewed and stable  Last Vitals:  Vitals:   12/23/16 0709  BP: (!) 153/86  Pulse: 81  Resp: 18  Temp: (!) 36.1 C  SpO2: 98%    Last Pain:  Vitals:   12/23/16 0709  TempSrc: Tympanic         Complications: No apparent anesthesia complications

## 2016-12-23 NOTE — Op Note (Signed)
[facility] Gastroenterology Patient Name: Dillon Henry Procedure Date: 12/23/2016 7:44 AM MRN: [id number] Account #: [id number] Date of Birth: Oct 23, 1950 Admit Type: Outpatient Age: 65 Room: [facility] ENDO ROOM 3 Gender: Male Note Status: Finalized Procedure:            Colonoscopy Indications:          Family history of colon cancer in a first-degree                        relative, Personal history of colonic polyps Providers:            Lollie Sails, MD Referring MD:         Elveria Rising. Damita Dunnings, MD (Referring MD) Medicines:            Monitored Anesthesia Care Complications:        No immediate complications. Procedure:            Pre-Anesthesia Assessment:                       - ASA Grade Assessment: III - A patient with severe                        systemic disease.                       After obtaining informed consent, the colonoscope was                        passed under direct vision. Throughout the procedure,                        the patient's blood pressure, pulse, and oxygen                        saturations were monitored continuously. The                        Colonoscope was introduced through the anus and                        advanced to the the cecum, identified by appendiceal                        orifice and ileocecal valve. The colonoscopy was                        performed with moderate difficulty due to multiple                        diverticula in the colon and a tortuous colon.                        Successful completion of the procedure was aided by                        using manual pressure. The quality of the bowel                        preparation was fair. Findings:      Many small and large-mouthed diverticula were found in the sigmoid  colon       and descending colon.      A 4 mm polyp was found in the descending colon. The polyp was sessile.       The polyp was removed with a cold biopsy forceps. Resection  and       retrieval were complete.      The retroflexed view of the distal rectum and anal verge was normal and       showed no anal or rectal abnormalities.      The digital rectal exam was normal. Impression:           - Preparation of the colon was fair.                       - Diverticulosis in the sigmoid colon and in the                        descending colon.                       - One 4 mm polyp in the descending colon, removed with                        a cold biopsy forceps. Resected and retrieved.                       - The distal rectum and anal verge are normal on                        retroflexion view. Recommendation:       - Discharge patient to home.                       - Telephone GI clinic for pathology results in 1 week. Procedure Code(s):    --- Professional ---                       [phone], Colonoscopy, flexible; with biopsy, single or                        multiple Diagnosis Code(s):    --- Professional ---                       D12.4, Benign neoplasm of descending colon                       Z80.0, Family history of malignant neoplasm of                        digestive organs                       Z86.010, Personal history of colonic polyps                       K57.30, Diverticulosis of large intestine without                        perforation or abscess without bleeding CPT copyright 2016 American Medical Association. All rights reserved. The codes documented in this report are preliminary and upon coder review may  be revised to meet current compliance requirements.  Lollie Sails, MD 12/23/2016 8:24:19 AM This report has been signed electronically. Number of Addenda: 0 Note Initiated On: 12/23/2016 7:44 AM Scope Withdrawal Time: 0 hours 15 minutes 33 seconds  Total Procedure Duration: 0 hours 28 minutes 21 seconds       [facility]

## 2016-12-23 NOTE — Anesthesia Preprocedure Evaluation (Signed)
Anesthesia Evaluation  Patient identified by MRN, date of birth, ID band Patient awake    Reviewed: Allergy & Precautions, NPO status , Patient's Chart, lab work & pertinent test results  History of Anesthesia Complications Negative for: history of anesthetic complications  Airway Mallampati: II  TM Distance: >3 FB Neck ROM: Full    Dental  (+) Missing   Pulmonary neg sleep apnea, neg COPD, former smoker,    breath sounds clear to auscultation- rhonchi (-) wheezing      Cardiovascular hypertension, Pt. on medications (-) CAD, (-) Past MI and (-) Cardiac Stents + dysrhythmias (hx of ablation) Atrial Fibrillation  Rhythm:Regular Rate:Normal - Systolic murmurs and - Diastolic murmurs    Neuro/Psych negative neurological ROS  negative psych ROS   GI/Hepatic negative GI ROS, Neg liver ROS,   Endo/Other  diabetes, Insulin Dependent  Renal/GU negative Renal ROS     Musculoskeletal negative musculoskeletal ROS (+)   Abdominal (+) + obese,   Peds  Hematology negative hematology ROS (+)   Anesthesia Other Findings Past Medical History: 1994: Diabetes mellitus     Comment:  Type II 05/1997: Hyperlipidemia 1995: Hypertension   Reproductive/Obstetrics                             Anesthesia Physical Anesthesia Plan  ASA: III  Anesthesia Plan: General   Post-op Pain Management:    Induction: Intravenous  PONV Risk Score and Plan: 1 and Propofol infusion  Airway Management Planned: Natural Airway  Additional Equipment:   Intra-op Plan:   Post-operative Plan:   Informed Consent: I have reviewed the patients History and Physical, chart, labs and discussed the procedure including the risks, benefits and alternatives for the proposed anesthesia with the patient or authorized representative who has indicated his/her understanding and acceptance.   Dental advisory given  Plan Discussed  with: CRNA and Anesthesiologist  Anesthesia Plan Comments:         Anesthesia Quick Evaluation

## 2016-12-23 NOTE — H&P (Signed)
Outpatient short stay form Pre-procedure 12/23/2016 7:40 AM Lollie Sails MD  Primary Physician: Dr Dion Body  Reason for visit:  colonoscopy  History of present illness:  Patient is a 66 yo male presenting as above. He has a family history of colon cancer in a primary relative and personal history of adenomatous colon polyps.  He tolerated his prep well, he has held a 81 mg asa for several days and takes no other asa products or blood thinner.      Current Facility-Administered Medications:  .  0.9 %  sodium chloride infusion, , Intravenous, Continuous, Lollie Sails, MD, Last Rate: 20 mL/hr at 12/23/16 0724, 1,000 mL at 12/23/16 0724 .  0.9 %  sodium chloride infusion, , Intravenous, Continuous, Lollie Sails, MD  Prescriptions Prior to Admission  Medication Sig Dispense Refill Last Dose  . amLODipine (NORVASC) 5 MG tablet Take 5 mg by mouth daily.     Marland Kitchen aspirin EC 81 MG tablet Take 81 mg by mouth daily.     . benazepril (LOTENSIN) 20 MG tablet Take 20 mg by mouth daily.   12/23/2016 at Unknown time  . carvedilol (COREG) 12.5 MG tablet Take 12.5 mg by mouth 2 (two) times daily with a meal.     12/23/2016 at Unknown time  . etodolac (LODINE) 400 MG tablet Take 400 mg by mouth 2 (two) times daily.     . hydrochlorothiazide (HYDRODIURIL) 25 MG tablet Take 25 mg by mouth daily.     . insulin detemir (LEVEMIR) 100 UNIT/ML injection 30 units injected in am and 50 units evening   12/23/2016 at Unknown time  . insulin regular (NOVOLIN R,HUMULIN R) 100 units/mL injection Inject into the skin 3 (three) times daily before meals.     . magnesium oxide (MAG-OX) 400 MG tablet Take 400 mg by mouth daily.     . TGT PSYLLIUM FIBER PO Take by mouth every other day.     . zinc gluconate 50 MG tablet Take 50 mg by mouth daily.     . [DISCONTINUED] loteprednol (LOTEMAX) 0.2 % SUSP 1 drop 4 (four) times daily.     Marland Kitchen glyBURIDE (DIABETA) 5 MG tablet Take 5 mg by mouth 2 (two) times daily  with a meal.    Taking  . metFORMIN (GLUCOPHAGE) 1000 MG tablet Take 1,000 mg by mouth 2 (two) times daily with a meal.    Taking     Allergies  Allergen Reactions  . Pioglitazone     REACTION: TOOTH PAIN  . Rosiglitazone Maleate     REACTION: WEIGHT GAIN     Past Medical History:  Diagnosis Date  . Diabetes mellitus 1994   Type II  . Hyperlipidemia 05/1997  . Hypertension 1995    Review of systems:      Physical Exam    Heart and lungs: rrr without rmg, bcta    HEENT: ncat    Other    Pertinant exam for procedure: soft nontender nondistended bowel sounds positive normoactive    Planned proceedures:  Colonoscopy  With indicated proceedures. I have discussed the risks benefits and complications of procedures to include not limited to bleeding, infection, perforation and the risk of sedation and the patient wishes to proceed.    Lollie Sails, MD Gastroenterology 12/23/2016  7:40 AM

## 2016-12-24 ENCOUNTER — Encounter: Payer: Self-pay | Admitting: Gastroenterology

## 2016-12-24 LAB — SURGICAL PATHOLOGY

## 2016-12-29 ENCOUNTER — Ambulatory Visit (INDEPENDENT_AMBULATORY_CARE_PROVIDER_SITE_OTHER): Payer: BLUE CROSS/BLUE SHIELD | Admitting: Ophthalmology

## 2016-12-29 DIAGNOSIS — E11311 Type 2 diabetes mellitus with unspecified diabetic retinopathy with macular edema: Secondary | ICD-10-CM

## 2016-12-29 DIAGNOSIS — E113512 Type 2 diabetes mellitus with proliferative diabetic retinopathy with macular edema, left eye: Secondary | ICD-10-CM

## 2016-12-29 DIAGNOSIS — H2513 Age-related nuclear cataract, bilateral: Secondary | ICD-10-CM | POA: Diagnosis not present

## 2016-12-29 DIAGNOSIS — H35033 Hypertensive retinopathy, bilateral: Secondary | ICD-10-CM

## 2016-12-29 DIAGNOSIS — H43813 Vitreous degeneration, bilateral: Secondary | ICD-10-CM

## 2016-12-29 DIAGNOSIS — I10 Essential (primary) hypertension: Secondary | ICD-10-CM | POA: Diagnosis not present

## 2016-12-29 DIAGNOSIS — E113311 Type 2 diabetes mellitus with moderate nonproliferative diabetic retinopathy with macular edema, right eye: Secondary | ICD-10-CM

## 2017-01-06 ENCOUNTER — Encounter: Payer: Self-pay | Admitting: Family Medicine

## 2017-07-01 ENCOUNTER — Encounter (INDEPENDENT_AMBULATORY_CARE_PROVIDER_SITE_OTHER): Payer: BLUE CROSS/BLUE SHIELD | Admitting: Ophthalmology

## 2017-07-01 DIAGNOSIS — H35033 Hypertensive retinopathy, bilateral: Secondary | ICD-10-CM | POA: Diagnosis not present

## 2017-07-01 DIAGNOSIS — E113593 Type 2 diabetes mellitus with proliferative diabetic retinopathy without macular edema, bilateral: Secondary | ICD-10-CM

## 2017-07-01 DIAGNOSIS — I10 Essential (primary) hypertension: Secondary | ICD-10-CM | POA: Diagnosis not present

## 2017-07-01 DIAGNOSIS — E11319 Type 2 diabetes mellitus with unspecified diabetic retinopathy without macular edema: Secondary | ICD-10-CM

## 2017-07-01 DIAGNOSIS — H43813 Vitreous degeneration, bilateral: Secondary | ICD-10-CM | POA: Diagnosis not present

## 2017-07-01 DIAGNOSIS — H2513 Age-related nuclear cataract, bilateral: Secondary | ICD-10-CM

## 2017-11-02 ENCOUNTER — Encounter (INDEPENDENT_AMBULATORY_CARE_PROVIDER_SITE_OTHER): Payer: BLUE CROSS/BLUE SHIELD | Admitting: Ophthalmology

## 2018-01-02 ENCOUNTER — Encounter: Payer: Self-pay | Admitting: Emergency Medicine

## 2018-01-02 ENCOUNTER — Emergency Department
Admission: EM | Admit: 2018-01-02 | Discharge: 2018-01-03 | Disposition: A | Discharge status: Home / Self Care | Payer: BLUE CROSS/BLUE SHIELD | Attending: Emergency Medicine | Admitting: Emergency Medicine

## 2018-01-02 DIAGNOSIS — I10 Essential (primary) hypertension: Secondary | ICD-10-CM | POA: Insufficient documentation

## 2018-01-02 DIAGNOSIS — E1140 Type 2 diabetes mellitus with diabetic neuropathy, unspecified: Secondary | ICD-10-CM | POA: Diagnosis not present

## 2018-01-02 DIAGNOSIS — Z79899 Other long term (current) drug therapy: Secondary | ICD-10-CM | POA: Diagnosis not present

## 2018-01-02 DIAGNOSIS — Z87891 Personal history of nicotine dependence: Secondary | ICD-10-CM | POA: Insufficient documentation

## 2018-01-02 DIAGNOSIS — R7989 Other specified abnormal findings of blood chemistry: Secondary | ICD-10-CM

## 2018-01-02 DIAGNOSIS — E11621 Type 2 diabetes mellitus with foot ulcer: Secondary | ICD-10-CM

## 2018-01-02 DIAGNOSIS — Z7982 Long term (current) use of aspirin: Secondary | ICD-10-CM | POA: Insufficient documentation

## 2018-01-02 DIAGNOSIS — M79675 Pain in left toe(s): Secondary | ICD-10-CM | POA: Diagnosis present

## 2018-01-02 DIAGNOSIS — Z794 Long term (current) use of insulin: Secondary | ICD-10-CM | POA: Diagnosis not present

## 2018-01-02 DIAGNOSIS — Z7902 Long term (current) use of antithrombotics/antiplatelets: Secondary | ICD-10-CM | POA: Insufficient documentation

## 2018-01-02 DIAGNOSIS — L97529 Non-pressure chronic ulcer of other part of left foot with unspecified severity: Secondary | ICD-10-CM | POA: Insufficient documentation

## 2018-01-02 LAB — CBC WITH DIFFERENTIAL/PLATELET
BASOS ABS: 0 10*3/uL (ref 0–0.1)
BASOS PCT: 1 %
EOS ABS: 0.2 10*3/uL (ref 0–0.7)
EOS PCT: 2 %
HCT: 36.5 % — ABNORMAL LOW (ref 40.0–52.0)
Hemoglobin: 13.1 g/dL (ref 13.0–18.0)
Lymphocytes Relative: 44 %
Lymphs Abs: 3.3 10*3/uL (ref 1.0–3.6)
MCH: 31.1 pg (ref 26.0–34.0)
MCHC: 36 g/dL (ref 32.0–36.0)
MCV: 86.6 fL (ref 80.0–100.0)
MONO ABS: 0.6 10*3/uL (ref 0.2–1.0)
Monocytes Relative: 8 %
Neutro Abs: 3.4 10*3/uL (ref 1.4–6.5)
Neutrophils Relative %: 45 %
PLATELETS: 210 10*3/uL (ref 150–440)
RBC: 4.22 MIL/uL — AB (ref 4.40–5.90)
RDW: 14.6 % — ABNORMAL HIGH (ref 11.5–14.5)
WBC: 7.6 10*3/uL (ref 3.8–10.6)

## 2018-01-02 LAB — COMPREHENSIVE METABOLIC PANEL
ALK PHOS: 56 U/L (ref 38–126)
ALT: 26 U/L (ref 0–44)
AST: 24 U/L (ref 15–41)
Albumin: 4.3 g/dL (ref 3.5–5.0)
Anion gap: 13 (ref 5–15)
BILIRUBIN TOTAL: 1.1 mg/dL (ref 0.3–1.2)
BUN: 19 mg/dL (ref 8–23)
CALCIUM: 9.1 mg/dL (ref 8.9–10.3)
CO2: 27 mmol/L (ref 22–32)
CREATININE: 0.98 mg/dL (ref 0.61–1.24)
Chloride: 95 mmol/L — ABNORMAL LOW (ref 98–111)
GFR calc Af Amer: >60 mL/min (ref >60)
Glucose, Bld: 292 mg/dL — ABNORMAL HIGH (ref 70–99)
POTASSIUM: 4.1 mmol/L (ref 3.5–5.1)
Sodium: 135 mmol/L (ref 135–145)
TOTAL PROTEIN: 7.3 g/dL (ref 6.5–8.1)

## 2018-01-02 LAB — LACTIC ACID, PLASMA: Lactic Acid, Venous: 2.7 mmol/L (ref 0.5–1.9)

## 2018-01-02 MED ORDER — SODIUM CHLORIDE 0.9 % IV BOLUS
1000.0000 mL | Freq: Once | INTRAVENOUS | Status: AC
  Administered 2018-01-02: 1000 mL via INTRAVENOUS

## 2018-01-02 NOTE — ED Triage Notes (Signed)
Patient presents to the ED with a large abscess on his toe that he states he first noticed today.  Patient reports being diabetic and states, "because of the problems I have with my feet, I was afraid to wait until next week to see a podiatrist."

## 2018-01-02 NOTE — ED Notes (Signed)
Pt reports having an infection in his big toe on left foot. States it was just a "red dot" yesterday and it was way worse today. Wife at bedside. Toe appears to be red and swollen.

## 2018-01-03 ENCOUNTER — Emergency Department: Payer: BLUE CROSS/BLUE SHIELD

## 2018-01-03 LAB — LACTIC ACID, PLASMA
Lactic Acid, Venous: 2.6 mmol/L (ref 0.5–1.9)
Lactic Acid, Venous: 2.4 mmol/L (ref 0.5–1.9)

## 2018-01-03 MED ORDER — LACTATED RINGERS IV BOLUS: 1000.0000 mL | Freq: Once | INTRAVENOUS | Status: DC

## 2018-01-03 MED ORDER — DOXYCYCLINE HYCLATE 100 MG PO CAPS: 100.0000 mg | ORAL_CAPSULE | Freq: Two times a day (BID) | ORAL | 0 refills | Status: AC

## 2018-01-03 MED ORDER — PIPERACILLIN-TAZOBACTAM 3.375 G IVPB 30 MIN
3.3750 g | Freq: Once | INTRAVENOUS | Status: AC
  Administered 2018-01-03: 3.375 g via INTRAVENOUS
  Filled 2018-01-03: qty 50

## 2018-01-03 MED ORDER — SODIUM CHLORIDE 0.9 % IV BOLUS
1000.0000 mL | Freq: Once | INTRAVENOUS | Status: AC
  Administered 2018-01-03: 1000 mL via INTRAVENOUS

## 2018-01-03 MED ORDER — DOXYCYCLINE HYCLATE 100 MG PO TABS
100.0000 mg | ORAL_TABLET | Freq: Once | ORAL | Status: AC
Start: 2018-01-03 — End: 2018-01-03
  Administered 2018-01-03: 100 mg via ORAL
  Filled 2018-01-03: qty 1

## 2018-01-03 MED ORDER — CEPHALEXIN 500 MG PO CAPS
500.0000 mg | ORAL_CAPSULE | Freq: Once | ORAL | Status: AC
  Administered 2018-01-03: 500 mg via ORAL
  Filled 2018-01-03: qty 1

## 2018-01-03 MED ORDER — CEPHALEXIN 500 MG PO CAPS: 500.0000 mg | ORAL_CAPSULE | Freq: Four times a day (QID) | ORAL | 0 refills | Status: AC

## 2018-01-03 NOTE — ED Provider Notes (Signed)
[facility] Emergency Department Provider Note  ____________________________________________   First MD Initiated Contact with Patient 01/02/18 2327     (approximate)  I have reviewed the triage vital signs and the nursing notes.   HISTORY  Chief Complaint Foot Problem    HPI Dillon Henry is a 67 y.o. 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 Elvina Mattes for podiatry.  He presents today for a new onset and what he describes as severe redness and ulcer on the top of his left great toe.  He says that yesterday he can feel a little bit of discomfort like something was rubbing on it but he does have chronic peripheral neuropathy.  He looked at the toe today and he has a "crater" at the top of the toe in the center of an area of redness and swelling and he did not want to wait for treatment because he waited the last time and had to have part of his toe amputated.  He denies fever/chills, chest pain, shortness of breath, nausea, vomiting, and abdominal pain.  He has no neurological symptoms other than the persistent and chronic diabetic neuropathy.  He was doing a lot of work outside in the yard today with his usual level of activity and has not felt systemically ill recently.  Nothing in particular makes the symptoms better or worse.  Past Medical History:  Diagnosis Date  . Diabetes mellitus 1994   Type II  . Hyperlipidemia 05/1997  . Hypertension 1995    Patient Active Problem List   Diagnosis Date Noted  . 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 UNSPECIFIED VITAMIN D DEFICIENCY 01/31/2009  . ERECTILE DYSFUNCTION 10/27/2007  . KNEE PAIN, RIGHT 06/15/2007  . DIABETES MELLITUS, TYPE II 03/08/2007  . HYPERLIPIDEMIA 03/08/2007  . HYPERTENSION 03/08/2007  . FIBRILLATION, ATRIAL 03/05/2007    Past Surgical History:  Procedure Laterality Date  . ATRIAL  ABLATION SURGERY  2005   For A Fib  . COLONOSCOPY WITH PROPOFOL N/A 12/23/2016   Procedure: COLONOSCOPY WITH PROPOFOL;  Surgeon: Lollie Sails, MD;  [facility] ENDOSCOPY;  Service: Endoscopy;  Laterality: N/A;  . DOBUTAMINE STRESS ECHO  11/26/2005   Nonsust VTach HTSVE ECG NML  . DOPPLER ECHOCARDIOGRAPHY  1999   Normal, EF 60%  . DOPPLER ECHOCARDIOGRAPHY  01/11/2001   Mild CMC, LVH, TR MR, TR: ([place])  . ETT  06/1998   Normal  . Exercise tolerance test  05/1998   OK  . Stress cardilite  12/1997   Normal  . Stress cardiolite  03/02/2002   WNL  EF 70%  . TOE AMPUTATION    . TONSILLECTOMY AND ADENOIDECTOMY    . VASECTOMY      Prior to Admission medications   Medication Sig Start Date End Date Taking? Authorizing Provider  acetaminophen (TYLENOL) 500 MG tablet Take 1,000 mg by mouth 3 (three) times daily.   Yes [provider]  amLODipine (NORVASC) 5 MG tablet Take 5 mg by mouth daily.   Yes [provider]  aspirin EC 81 MG tablet Take 81 mg by mouth daily.   Yes [provider]  carvedilol (COREG) 12.5 MG tablet Take 12.5 mg by mouth 2 (two) times daily with a meal.     Yes [provider]  etodolac (LODINE) 400 MG tablet Take 400 mg by mouth 2 (two) times daily as needed  for mild pain.    Yes [provider]  glyBURIDE (DIABETA) 5 MG tablet Take 5 mg by mouth 2 (two) times daily with a meal.    Yes [provider]  hydrochlorothiazide (HYDRODIURIL) 25 MG tablet Take 25 mg by mouth daily.   Yes [provider]  insulin detemir (LEVEMIR) 100 UNIT/ML injection Inject 30 Units into the skin 2 (two) times daily.    Yes [provider]  insulin regular (NOVOLIN R,HUMULIN R) 100 units/mL injection Inject 0-20 Units into the skin 3 (three) times daily before meals. Per sliding scale   Yes [provider]  losartan (COZAAR) 50 MG tablet Take 50 mg by mouth daily. 12/08/17  Yes [provider]    magnesium oxide (MAG-OX) 400 MG tablet Take 400 mg by mouth daily.   Yes [provider]  metFORMIN (GLUCOPHAGE) 1000 MG tablet Take 1,000 mg by mouth 2 (two) times daily with a meal.    Yes [provider]  polycarbophil (FIBERCON) 625 MG tablet Take 625 mg by mouth daily.   Yes [provider]  pravastatin (PRAVACHOL) 20 MG tablet Take 20 mg by mouth at bedtime. 12/10/17  Yes [provider]  zinc gluconate 50 MG tablet Take 50 mg by mouth daily.   Yes [provider]  cephALEXin (KEFLEX) 500 MG capsule Take 1 capsule (500 mg total) by mouth 4 (four) times daily for 14 days. 01/03/18 01/17/18  Hinda Kehr, MD  doxycycline (VIBRAMYCIN) 100 MG capsule Take 1 capsule (100 mg total) by mouth 2 (two) times daily for 14 days. 01/03/18 01/17/18  Hinda Kehr, MD  HUMALOG MIX 75/25 (75-25) 100 UNIT/ML SUSP injection Inject 20 Units into the skin 2 (two) times daily. 12/14/17   [provider]    Allergies Pioglitazone and Rosiglitazone maleate  Family History  Problem Relation Age of Onset  . Cancer Father        Leukemia  . Stroke Father   . Cancer Sister        Colon  . Heart disease Other        Multiple MI's  . Stroke Other        90's  . Heart disease Other   . Diabetes Other   . Diabetes Maternal Grandmother   . Depression Neg Hx   . Alcohol abuse Neg Hx   . Drug abuse Neg Hx     Social History Social History   Tobacco Use  . Smoking status: Former Smoker    Packs/day: 1.00    Years: 10.00    Pack years: 10.00    Types: Cigarettes  . Smokeless tobacco: Former Network engineer Use Topics  . Alcohol use: Not on file  . Drug use: Not on file    Review of Systems Constitutional: No fever/chills Eyes: No visual changes. ENT: No sore throat. Cardiovascular: Denies chest pain. Respiratory: Denies shortness of breath. Gastrointestinal: No abdominal pain.  No nausea, no vomiting.  No diarrhea.  No  constipation. Genitourinary: Negative for dysuria. Musculoskeletal: Negative for neck pain.  Negative for back pain. Integumentary: New onset red and crater-like ulcer on the top of his left great toe. Neurological: Negative for headaches, focal weakness or numbness.   ____________________________________________   PHYSICAL EXAM:  VITAL SIGNS: ED Triage Vitals [01/02/18 2214]  Enc Vitals Group     BP (!) 177/84     Pulse Rate 87     Resp 18     Temp 98.1  F (36.7 C)     Temp Source Oral     SpO2 98 %     Weight 106.6 kg (235 lb)     Height 1.88 m (6\' 2" )     Head Circumference      Peak Flow      Pain Score 0     Pain Loc      Pain Edu?      Excl. in [place]?     Constitutional: Alert and oriented. Well appearing and in no acute distress. Eyes: Conjunctivae are normal.  Head: Atraumatic. Nose: No congestion/rhinnorhea. Mouth/Throat: Mucous membranes are moist. Neck: No stridor.  No meningeal signs.   Cardiovascular: Normal rate, regular rhythm. Good peripheral circulation. Grossly normal heart sounds. Respiratory: Normal respiratory effort.  No retractions. Lungs CTAB. Gastrointestinal: Soft and nontender. No distention.  Musculoskeletal: No lower extremity tenderness nor edema. No gross deformities of extremities except for Skin exam below. Neurologic:  Normal speech and language. No gross focal neurologic deficits are appreciated.  Psychiatric: Mood and affect are normal. Speech and behavior are normal. Skin:  Skin is warm and dry.  He has blanching erythema and swelling to the left great toe with a central subcentimeter craterous lesion consistent with a pressure ulcer.  There is no purulence at this time.  The wound is relatively superficial currently and I doubt bone involvement.  See below for photo:     ____________________________________________   LABS (all labs ordered are listed, but only abnormal results are displayed)  Labs Reviewed  LACTIC ACID, PLASMA  - Abnormal; Notable for the following components:      Result Value   Lactic Acid, Venous 2.7 (*)    All other components within normal limits  LACTIC ACID, PLASMA - Abnormal; Notable for the following components:   Lactic Acid, Venous 2.6 (*)    All other components within normal limits  COMPREHENSIVE METABOLIC PANEL - Abnormal; Notable for the following components:   Chloride 95 (*)    Glucose, Bld 292 (*)    All other components within normal limits  CBC WITH DIFFERENTIAL/PLATELET - Abnormal; Notable for the following components:   RBC 4.22 (*)    HCT 36.5 (*)    RDW 14.6 (*)    All other components within normal limits  LACTIC ACID, PLASMA - Abnormal; Notable for the following components:   Lactic Acid, Venous 2.4 (*)    All other components within normal limits   ____________________________________________  EKG  None - EKG not ordered by ED physician ____________________________________________  RADIOLOGY Ursula Alert, personally viewed and evaluated these images (plain radiographs) as part of my medical decision making, as well as reviewing the written report by the radiologist.  ED MD interpretation: Questionable bone involvement of the great toe ulcer  Official radiology report(s): Dg Toe Great Left  Result Date: 01/03/2018 CLINICAL DATA:  Wound on top of left great toe. Evaluate for possible osteomyelitis. EXAM: LEFT GREAT TOE COMPARISON:  None. FINDINGS: Articular surface irregularity involving the great toe metatarsal phalangeal joint with ill-defined articular margins and heterogeneous decreased bone mineral density. Mild peripheral spurring also seen. Site of wound is not well seen radiographically. No soft tissue air or radiopaque foreign body. IMPRESSION: 1. Joint space irregularity of the first metatarsal phalangeal joint suspicious for osteomyelitis in the setting of wound. 2. The wound described clinically is not seen radiographically. No soft tissue air or  radiopaque foreign body. Electronically Signed   By: Aurther Loft.D.  On: 01/03/2018 00:37    ____________________________________________   PROCEDURES  Critical Care performed: No   Procedure(s) performed:   Procedures   ____________________________________________   INITIAL IMPRESSION / ASSESSMENT AND PLAN / ED COURSE  As part of my medical decision making, I reviewed the following data within the [facility] notes reviewed and incorporated, Labs reviewed , Old chart reviewed, Radiograph reviewed , Discussed with podiatrist (Dr Cleda Mccreedy) and reviewed Notes from prior ED visits    Differential diagnosis includes, but is not limited to, pressure ulcer to the great toe in the setting of insulin-dependent diabetes and diabetic neuropathy, cellulitis, osteomyelitis.  This is not a puncture wound but I think that with the work he is doing out in the yard he had the inside of his shoe or boot rubbed up against the toe and has led to some localized cellulitis with a pressure ulcer.  I will start him on antibiotics as described below.  His radiograph was reassuring with no sign of osteomyelitis.  He has no systemic symptoms.  His lactic acid was initially elevated but I do not believe this represents sepsis given that the rest of his work-up including his white blood cell count and his vital signs are all normal.  I gave a liter of fluids and his lactic acid was normal upon repeat.  He will follow-up with Dr. Elvina Mattes at the next available opportunity and I gave my usual and customary return precautions.  Clinical Course as of Jan 03 706  Sun Jan 03, 2018  0043 Question of possible osteomyelitis according to radiologist  DG Toe Great Left [CF]  0246 Discussed case with Dr. Cleda Mccreedy.  He and I both agree that it would be very unlikely for the patient already to have osteomyelitis when he is only had some redness on the toe over the last couple of days and has only just  now developed into an ulcer.  He is comfortable with the plan for discharge and outpatient follow-up and recommended an orthopedic hard soled shoe which the patient already has at home.  He agreed with my antibiotic selection of Keflex and doxycycline.  The patient's lactic acid only came down to 2.6 after 1 L of normal saline.  Given the persistent lactic acid I am giving a second liter bolus which the patient agrees with.  I am also going to give a one-time dose of Zosyn 3.375 g IV for aggressive and broad-spectrum antibiotic coverage to hopefully help facilitate aggressive treatment of the wound and he can go home on the same antibiotics.  I will also send a message to Dr. Elvina Mattes through [facility] to facilitate follow-up.  The patient agrees with all of this.   [CF]  0537 Lactic acid still elevated, but only very slightly.  No other signs of systemic infection, does not qualify as sepsis.  Gave doxy, keflex, and Zosyn.  Patient asymptomatic except for the toe ulcer, comfortable with plan for outpatient follow up.  Gave usual/customary return precautions.  Lactic Acid, Venous(!!): 2.4 [CF]    Clinical Course User Index [CF] Hinda Kehr, MD    ____________________________________________  FINAL CLINICAL IMPRESSION(S) / ED DIAGNOSES  Final diagnoses:  Diabetic ulcer of toe of left foot associated with type 2 diabetes mellitus, unspecified ulcer stage (HCC)  Elevated lactic acid level     MEDICATIONS GIVEN DURING THIS VISIT:  Medications  sodium chloride 0.9 % bolus 1,000 mL (0 mLs Intravenous Stopped 01/03/18 0151)  cephALEXin (KEFLEX) capsule 500  mg (500 mg Oral Given 01/03/18 0152)  doxycycline (VIBRA-TABS) tablet 100 mg (100 mg Oral Given 01/03/18 0152)  sodium chloride 0.9 % bolus 1,000 mL (0 mLs Intravenous Stopped 01/03/18 0417)  piperacillin-tazobactam (ZOSYN) IVPB 3.375 g (0 g Intravenous Stopped 01/03/18 0417)     ED Discharge Orders         Ordered    cephALEXin (KEFLEX) 500 MG  capsule  4 times daily     01/03/18 0348    doxycycline (VIBRAMYCIN) 100 MG capsule  2 times daily     01/03/18 0348           Note:  This document was prepared using Dragon voice recognition software and may include unintentional dictation errors.    Hinda Kehr, MD 01/03/18 [phone]

## 2018-01-03 NOTE — ED Notes (Signed)
Date and time results received: 01/03/18 0503 (use smartphrase ".now" to insert current time)  Test: lactic acid Critical Value: 2.4  Name of Provider Notified: Dr. Karma Greaser  Orders Received? Or Actions Taken?:

## 2018-01-03 NOTE — Discharge Instructions (Addendum)
As we discussed, your work-up was generally reassuring, but there is some question of possible bone involvement on your x-ray.  Your lactic acid level was elevated, but this is more likely due to the work/exercise you had yesterday than your ulcer/infection.  The podiatrist on call and I both think it is appropriate for you to follow-up on Monday with Dr. Elvina Mattes.  Please go to his walk-in clinic hours or call first thing in the morning on Monday for the next available appointment.  Take the prescribed antibiotics as written and plan on taking the full 14-day course of medication.  Return to the emergency department if you develop new or worsening symptoms that concern you.

## 2018-11-16 ENCOUNTER — Encounter (INDEPENDENT_AMBULATORY_CARE_PROVIDER_SITE_OTHER): Payer: Medicare HMO | Admitting: Ophthalmology

## 2018-11-16 ENCOUNTER — Other Ambulatory Visit: Payer: Self-pay

## 2018-11-16 DIAGNOSIS — H35033 Hypertensive retinopathy, bilateral: Secondary | ICD-10-CM | POA: Diagnosis not present

## 2018-11-16 DIAGNOSIS — E113593 Type 2 diabetes mellitus with proliferative diabetic retinopathy without macular edema, bilateral: Secondary | ICD-10-CM

## 2018-11-16 DIAGNOSIS — H35371 Puckering of macula, right eye: Secondary | ICD-10-CM

## 2018-11-16 DIAGNOSIS — I10 Essential (primary) hypertension: Secondary | ICD-10-CM | POA: Diagnosis not present

## 2018-11-16 DIAGNOSIS — H2513 Age-related nuclear cataract, bilateral: Secondary | ICD-10-CM

## 2018-11-16 DIAGNOSIS — E11319 Type 2 diabetes mellitus with unspecified diabetic retinopathy without macular edema: Secondary | ICD-10-CM

## 2018-11-16 DIAGNOSIS — H43813 Vitreous degeneration, bilateral: Secondary | ICD-10-CM

## 2019-02-16 ENCOUNTER — Encounter (INDEPENDENT_AMBULATORY_CARE_PROVIDER_SITE_OTHER): Payer: Medicare HMO | Admitting: Ophthalmology

## 2019-02-16 DIAGNOSIS — H35373 Puckering of macula, bilateral: Secondary | ICD-10-CM | POA: Diagnosis not present

## 2019-02-16 DIAGNOSIS — E113593 Type 2 diabetes mellitus with proliferative diabetic retinopathy without macular edema, bilateral: Secondary | ICD-10-CM

## 2019-02-16 DIAGNOSIS — H35033 Hypertensive retinopathy, bilateral: Secondary | ICD-10-CM

## 2019-02-16 DIAGNOSIS — H43813 Vitreous degeneration, bilateral: Secondary | ICD-10-CM

## 2019-02-16 DIAGNOSIS — I10 Essential (primary) hypertension: Secondary | ICD-10-CM | POA: Diagnosis not present

## 2019-08-16 ENCOUNTER — Other Ambulatory Visit: Payer: Self-pay

## 2019-08-16 ENCOUNTER — Encounter (INDEPENDENT_AMBULATORY_CARE_PROVIDER_SITE_OTHER): Payer: Medicare HMO | Admitting: Ophthalmology

## 2019-08-16 DIAGNOSIS — E11319 Type 2 diabetes mellitus with unspecified diabetic retinopathy without macular edema: Secondary | ICD-10-CM

## 2019-08-16 DIAGNOSIS — E113593 Type 2 diabetes mellitus with proliferative diabetic retinopathy without macular edema, bilateral: Secondary | ICD-10-CM | POA: Diagnosis not present

## 2019-08-16 DIAGNOSIS — H35033 Hypertensive retinopathy, bilateral: Secondary | ICD-10-CM | POA: Diagnosis not present

## 2019-08-16 DIAGNOSIS — I10 Essential (primary) hypertension: Secondary | ICD-10-CM | POA: Diagnosis not present

## 2019-08-16 DIAGNOSIS — H43813 Vitreous degeneration, bilateral: Secondary | ICD-10-CM

## 2019-09-14 IMAGING — DX DG TOE GREAT 2+V*L*
3 series · 3 of 3 positions shown · non-contrast
Comparison: None.

CLINICAL DATA: Wound on top of left great toe. Evaluate for
possible osteomyelitis.

EXAM:
LEFT GREAT TOE

[toe ap]
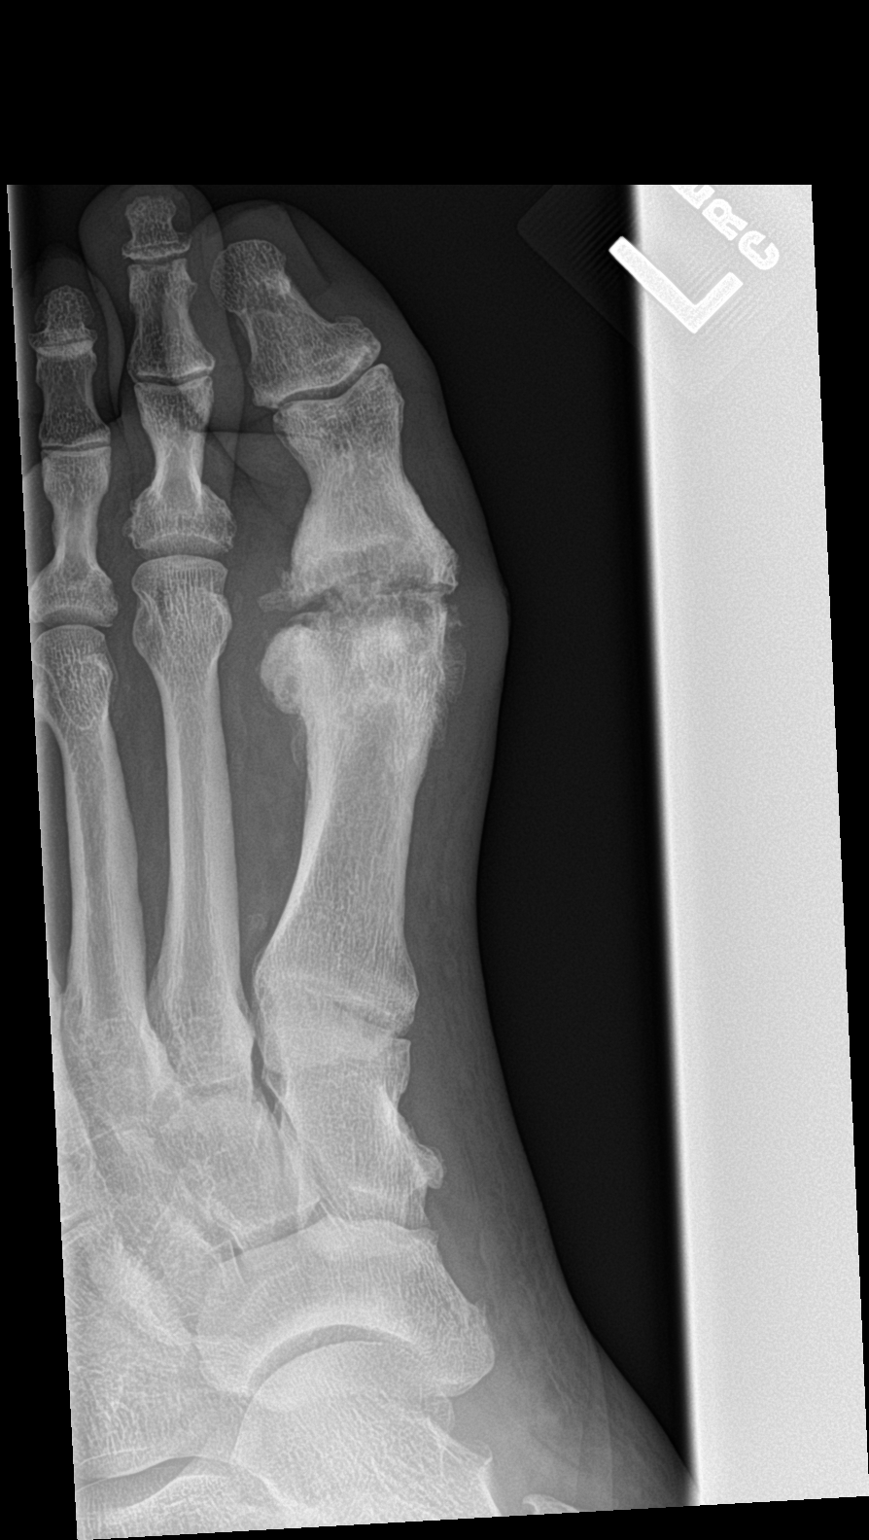

[toe obl]
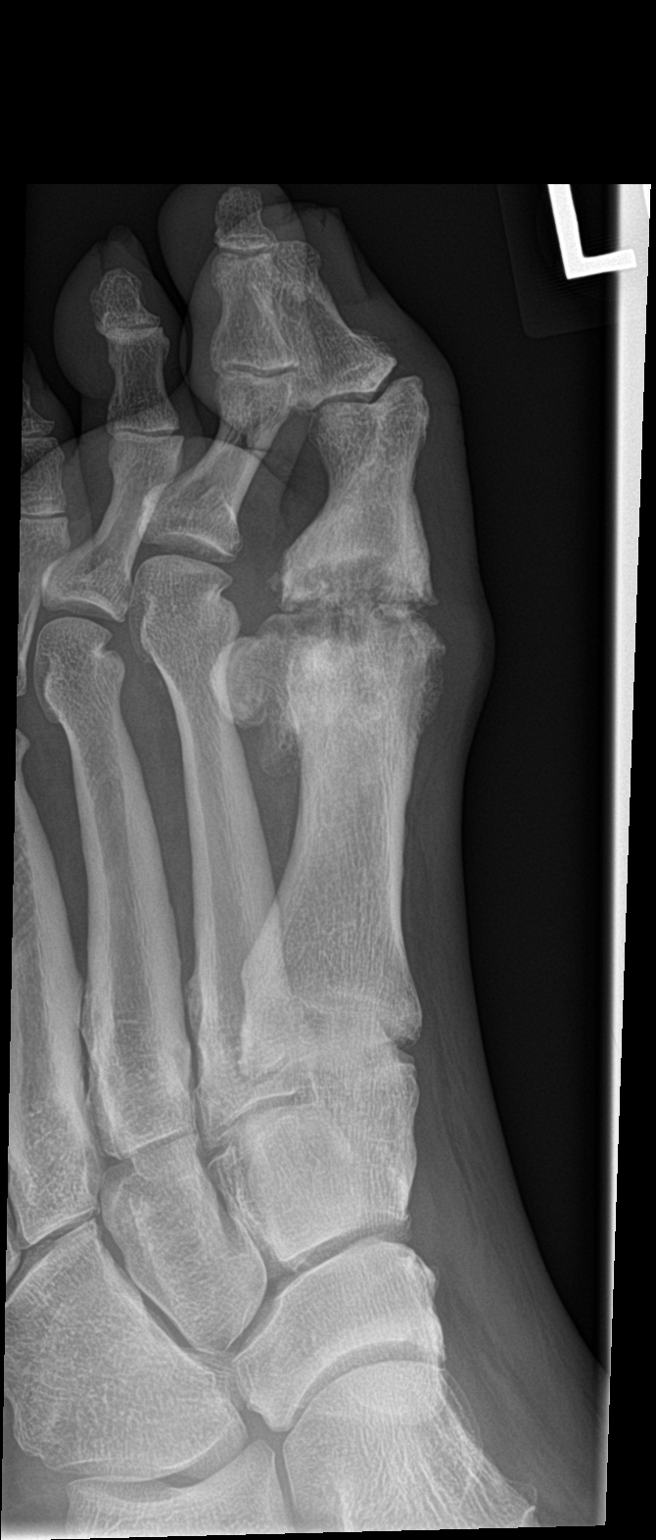

[toe lat]
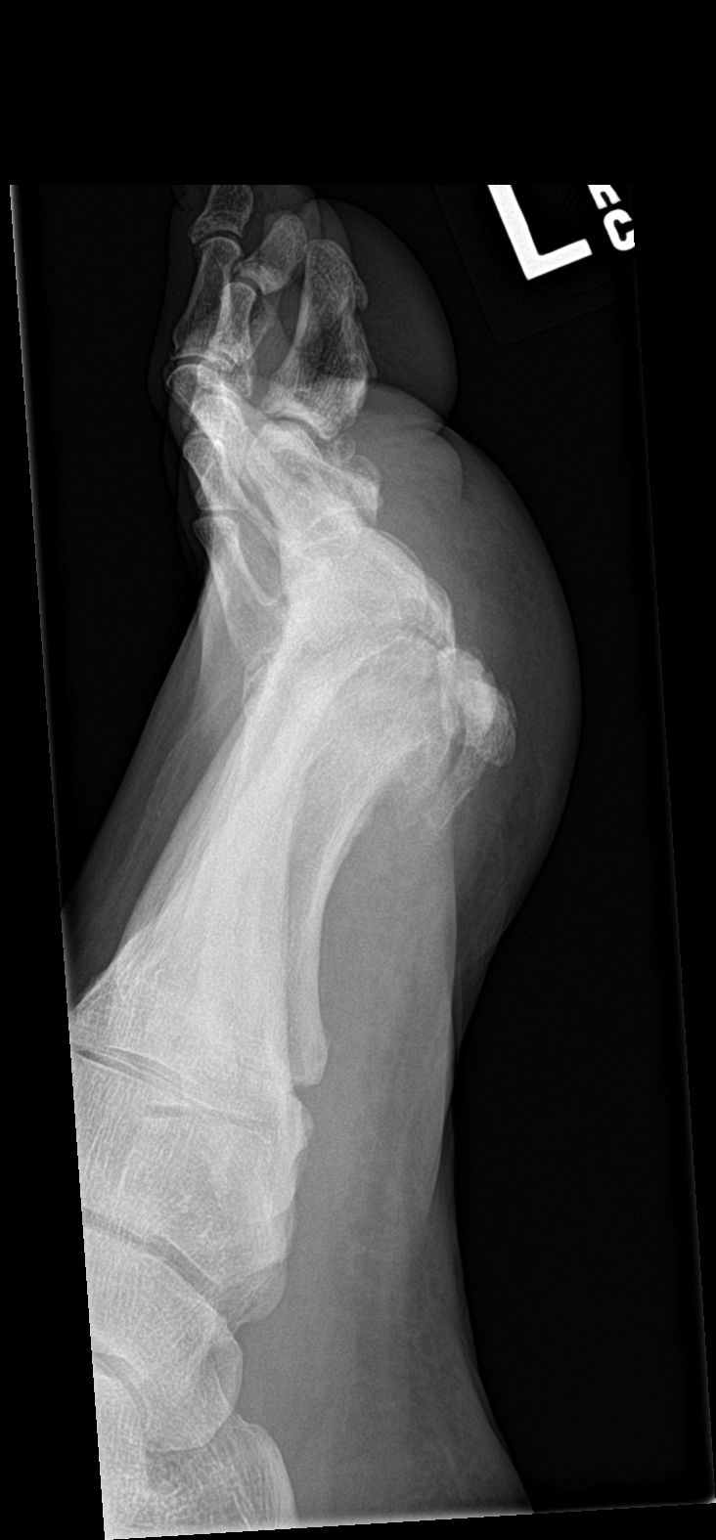

[3 of 3 positions shown; findings below may reference images not displayed]

FINDINGS: Articular surface irregularity involving the great toe metatarsal
phalangeal joint with ill-defined articular margins and
heterogeneous decreased bone mineral density. Mild peripheral
spurring also seen. Site of wound is not well seen radiographically.
No soft tissue air or radiopaque foreign body.
IMPRESSION: 1. Joint space irregularity of the first metatarsal phalangeal joint
suspicious for osteomyelitis in the setting of wound.
2. The wound described clinically is not seen radiographically. No
soft tissue air or radiopaque foreign body.

## 2019-10-20 ENCOUNTER — Encounter: Payer: Self-pay | Admitting: *Deleted

## 2019-10-20 ENCOUNTER — Encounter: Payer: Medicare HMO | Attending: Internal Medicine | Admitting: *Deleted

## 2019-10-20 ENCOUNTER — Other Ambulatory Visit: Payer: Self-pay

## 2019-10-20 VITALS — BP 140/74 | Ht 74.0 in | Wt 232.4 lb

## 2019-10-20 DIAGNOSIS — E119 Type 2 diabetes mellitus without complications: Secondary | ICD-10-CM | POA: Diagnosis not present

## 2019-10-20 NOTE — Patient Instructions (Signed)
Check blood sugars 4 x day before each meal and before bed every day or as directed by MD using FreeStyle Libre  Exercise: Continue stationary bike for 20-30 minutes  3  days a week and increase gradually to 150 minutes/weekly  Eat 3 meals day,  1-2  snacks a day Space meals 4-6 hours apart Include protein serving with each meal  Carry fast acting glucose and a snack at all times Rotate injection sites  Call if you want a visit with the nurse or dietitian

## 2019-10-21 NOTE — Progress Notes (Signed)
Diabetes Self-Management Education  Visit Type: First/Initial  Appt. Start Time: 1605 Appt. End Time: 4709  10/20/2019  [name], identified by name and date of birth, is a 69 y.o. male with a diagnosis of Diabetes: Type 2.   ASSESSMENT  Blood pressure (!) 140/74, height 6\' 2"  (1.88 m), weight (!) 232 lb 6.4 oz (105.4 kg). Body mass index is 29.84 kg/m.   Diabetes Self-Management Education - 10/20/19 1717      Visit Information   Visit Type First/Initial      Initial Visit   Diabetes Type Type 2    Are you currently following a meal plan? No    Are you taking your medications as prescribed? Yes    Date Diagnosed 26 years ago      Health Coping   How would you rate your overall health? Good      Psychosocial Assessment   Patient Belief/Attitude about Diabetes Other (comment)   "don't give it much thought; better than high cholesterol"   Self-care barriers None    Self-management support Doctor's office;Family;Friends    Patient Concerns Nutrition/Meal planning;Glycemic Control;Monitoring;Medication    Special Needs None    Preferred Learning Style Auditory;Visual    Learning Readiness Ready    How often do you need to have someone help you when you read instructions, pamphlets, or other written materials from your doctor or pharmacy? 1 - Never    What is the last grade level you completed in school? some college      Pre-Education Assessment   Patient understands the diabetes disease and treatment process. Needs Review    Patient understands incorporating nutritional management into lifestyle. Needs Review    Patient undertands incorporating physical activity into lifestyle. Needs Review    Patient understands using medications safely. Needs Review    Patient understands monitoring blood glucose, interpreting and using results Needs Review    Patient understands prevention, detection, and treatment of acute complications. Demonstrates understanding /  competency    Patient understands prevention, detection, and treatment of chronic complications. Demonstrates understanding / competency    Patient understands how to develop strategies to address psychosocial issues. Needs Review    Patient understands how to develop strategies to promote health/change behavior. Needs Review      Complications   Last HgB A1C per patient/outside source 6.6 %   09/29/2019   How often do you check your blood sugar? > 4 times/day   Pt has been wearing the FreeStyle Libre CGM x 1 week and checks blood sugars multiple times.    Fasting Blood glucose range (mg/dL) --   Average glucose for last week was 134 mg/dL Time in Range 75 %   Postprandial Blood glucose range (mg/dL) Pt checked his blood sugars in the office with reading of 200 mg/dL and again before he left with reading of 109 mg/dL.   Number of hypoglycemic episodes per month 4   Low range average on CGM was 6 % for last 7 days.   Can you tell when your blood sugar is low? Yes    What do you do if your blood sugar is low? Eats Smarties    Number of hyperglycemic episodes per week 4   High range average on CGM was 19 % for last 7 days.   Can you tell when your blood sugar is high? Yes    What do you do if your blood sugar is high? sometimes takes more insulin    Have  you had a dilated eye exam in the past 12 months? Yes    Have you had a dental exam in the past 12 months? No    Are you checking your feet? Yes    How many days per week are you checking your feet? 7      Dietary Intake   Breakfast all bran cereral and milk with banana or cranberries or raisins    Snack (morning) 0-1 snacks/day - apple, orange, peach    Lunch ham sandwich or fruit or potatoes and broccoli    Dinner chicken, pork, fish and occasional beef; potatoes, peas, beans, corn, rice, green beans, like multiple non-starchy vegetables    Beverage(s) Unsweetened tea, diet Mt Dew      Exercise   Exercise Type Light (walking / raking  leaves)    How many days per week to you exercise? 3    How many minutes per day do you exercise? 25    Total minutes per week of exercise 75      Patient Education   Previous Diabetes Education No    Disease state  Explored patient's options for treatment of their diabetes    Nutrition management  Role of diet in the treatment of diabetes and the relationship between the three main macronutrients and blood glucose level;Food label reading, portion sizes and measuring food.;Carbohydrate counting;Reviewed blood glucose goals for pre and post meals and how to evaluate the patients' food intake on their blood glucose level.;Meal timing in regards to the patients' current diabetes medication.    Physical activity and exercise  Role of exercise on diabetes management, blood pressure control and cardiac health.    Medications Taught/reviewed insulin injection, site rotation, insulin storage and needle disposal.;Reviewed patients medication for diabetes, action, purpose, timing of dose and side effects.    Monitoring Purpose and frequency of SMBG.;Taught/discussed recording of test results and interpretation of SMBG.;Identified appropriate SMBG and/or A1C goals.;Other (comment)   Time in Range using CGM   Acute complications Taught treatment of hypoglycemia - the 15 rule.    Chronic complications Relationship between chronic complications and blood glucose control    Psychosocial adjustment Identified and addressed patients feelings and concerns about diabetes      Individualized Goals (developed by patient)   Reducing Risk Other (comment)   improve blood sugars, decrease medications, prevent diabetes complications     Outcomes   Expected Outcomes Demonstrated interest in learning. Expect positive outcomes    Future DMSE PRN    Program Status Not Completed           Individualized Plan for Diabetes Self-Management Training:   Learning Objective:  Patient will have a greater understanding of  diabetes self-management. Patient education plan is to attend individual and/or group sessions per assessed needs and concerns.   Plan:   Patient Instructions  Check blood sugars 4 x day before each meal and before bed every day or as directed by MD using FreeStyle Libre Exercise: Continue stationary bike for 20-30 minutes  3  days a week and increase gradually to 150 minutes/weekly Eat 3 meals day,  1-2  snacks a day Space meals 4-6 hours apart Include protein serving with each meal Carry fast acting glucose and a snack at all times Rotate injection sites Call if you want a visit with the nurse or dietitian  Expected Outcomes:  Demonstrated interest in learning. Expect positive outcomes  Education material provided:  General Meal Planning Guidelines Simple Meal Plan Symptoms, causes  and treatments of Hypoglycemia Time in Range   If problems or questions, patient to contact team via:  Johny Drilling, RN, [place], [place] [phone]  Future DSME appointment: PRN  The patient reports he is not sure how much insurance will pay for this visit so he will call back if he needs to schedule an additional appointment with the nurse or dietitian.

## 2020-01-30 DIAGNOSIS — L98499 Non-pressure chronic ulcer of skin of other sites with unspecified severity: Secondary | ICD-10-CM

## 2020-01-30 DIAGNOSIS — H9319 Tinnitus, unspecified ear: Secondary | ICD-10-CM

## 2020-01-30 DIAGNOSIS — M869 Osteomyelitis, unspecified: Secondary | ICD-10-CM

## 2020-01-30 HISTORY — DX: Non-pressure chronic ulcer of skin of other sites with unspecified severity: L98.499

## 2020-01-30 HISTORY — DX: Tinnitus, unspecified ear: H93.19

## 2020-01-30 HISTORY — DX: Osteomyelitis, unspecified: M86.9

## 2020-02-15 ENCOUNTER — Other Ambulatory Visit: Payer: Self-pay | Admitting: Podiatry

## 2020-02-15 ENCOUNTER — Other Ambulatory Visit: Payer: Self-pay

## 2020-02-15 ENCOUNTER — Encounter
Admission: RE | Admit: 2020-02-15 | Discharge: 2020-02-15 | Disposition: A | Discharge status: Home / Self Care | Payer: Medicare HMO | Source: Ambulatory Visit | Attending: Podiatry | Admitting: Podiatry

## 2020-02-15 DIAGNOSIS — Z01818 Encounter for other preprocedural examination: Secondary | ICD-10-CM | POA: Diagnosis present

## 2020-02-15 DIAGNOSIS — Z20822 Contact with and (suspected) exposure to covid-19: Secondary | ICD-10-CM | POA: Diagnosis not present

## 2020-02-15 DIAGNOSIS — E119 Type 2 diabetes mellitus without complications: Secondary | ICD-10-CM | POA: Diagnosis not present

## 2020-02-15 DIAGNOSIS — I10 Essential (primary) hypertension: Secondary | ICD-10-CM | POA: Insufficient documentation

## 2020-02-15 HISTORY — DX: Unspecified osteoarthritis, unspecified site: M19.90

## 2020-02-15 HISTORY — DX: Other hammer toe(s) (acquired), left foot: M20.42

## 2020-02-15 HISTORY — DX: Cardiac arrhythmia, unspecified: I49.9

## 2020-02-15 HISTORY — DX: Type 2 diabetes mellitus with unspecified diabetic retinopathy without macular edema: E11.319

## 2020-02-15 HISTORY — DX: Charcot's joint, unspecified site: M14.60

## 2020-02-15 HISTORY — DX: Myoneural disorder, unspecified: G70.9

## 2020-02-15 NOTE — Patient Instructions (Addendum)
Your procedure is scheduled on:02-17-20 FRIDAY Report to the Registration Desk on the 1st floor of the [facility]-Then proceed to the 2nd floor Surgery Desk To find out your arrival time, please call [phone] between 1PM - 3PM on:02-16-20 THURSDAY  REMEMBER: Instructions that are not followed completely may result in serious medical risk, up to and including death; or upon the discretion of your surgeon and anesthesiologist your surgery may need to be rescheduled.  Do not eat food after midnight the night before surgery.  No gum chewing, lozengers or hard candies.  You may however, drink WATER up to 2 hours before you are scheduled to arrive for your surgery. Do not drink anything within 2 hours of your scheduled arrival time.  Type 1 and Type 2 diabetics should only drink water.  TAKE THESE MEDICATIONS THE MORNING OF SURGERY WITH A SIP OF WATER: -NONE   Stop Metformin 2 days prior to surgery-STOP NOW 02-15-20 WEDNESDAY (LAST DOSE WAS ON 02-15-20 AM DOSE)  Take 1/2 of usual insulin dose the night before surgery and none on the morning of surgery-TAKE HALF OF YOUR LEVEMIR THURSDAY NIGHT (02-16-20) AND NO INSULIN THE MORNING OF SURGERY  Follow recommendations from Cardiologist, Pulmonologist or PCP regarding stopping Aspirin, Coumadin, Plavix, Eliquis, Pradaxa, or Pletal-INSTRUCTED BY DR Netty Starring TO STOP ASPIRIN-LAST DOSE WAS 02-14-20 TUESDAY  One week prior to surgery: Stop Anti-inflammatories (NSAIDS) such as Advil, Aleve, Ibuprofen, Motrin, Naproxen, Naprosyn and Aspirin based products such as Excedrin, Goodys Powder, BC Powder-OK TO TAKE TYLENOL IF NEEDED  Stop ANY OVER THE COUNTER supplements until after surgery. (However, you may continue taking Magnesium Oxide and Zinc up until the day before surgery.)  No Alcohol for 24 hours before or after surgery.  No Smoking including e-cigarettes for 24 hours prior to surgery.  No chewable tobacco products for at least 6 hours  prior to surgery.  No nicotine patches on the day of surgery.  Do not use any "recreational" drugs for at least a week prior to your surgery.  Please be advised that the combination of cocaine and anesthesia may have negative outcomes, up to and including death. If you test positive for cocaine, your surgery will be cancelled.  On the morning of surgery brush your teeth with toothpaste and water, you may rinse your mouth with mouthwash if you wish. Do not swallow any toothpaste or mouthwash.  Do not wear jewelry, make-up, hairpins, clips or nail polish.  Do not wear lotions, powders, or perfumes.   Do not shave body from the neck down 48 hours prior to surgery just in case you cut yourself which could leave a site for infection.  Also, freshly shaved skin may become irritated if using the CHG soap.  Contact lenses, hearing aids and dentures may not be worn into surgery.  Do not bring valuables to the hospital. [facility] is not responsible for any missing/lost belongings or valuables.   Use CHG Soap as directed on instruction sheet  Notify your doctor if there is any change in your medical condition (cold, fever, infection).  Wear comfortable clothing (specific to your surgery type) to the hospital.  Plan for stool softeners for home use; pain medications have a tendency to cause constipation. You can also help prevent constipation by eating foods high in fiber such as fruits and vegetables and drinking plenty of fluids as your diet allows.  After surgery, you can help prevent lung complications by doing breathing exercises.  Take deep breaths and cough  every 1-2 hours. Your doctor may order a device called an Incentive Spirometer to help you take deep breaths. When coughing or sneezing, hold a pillow firmly against your incision with both hands. This is called "splinting." Doing this helps protect your incision. It also decreases belly discomfort.  If you are being admitted to the  hospital overnight, leave your suitcase in the car. After surgery it may be brought to your room.  If you are being discharged the day of surgery, you will not be allowed to drive home. You will need a responsible adult (18 years or older) to drive you home and stay with you that night.   If you are taking public transportation, you will need to have a responsible adult (18 years or older) with you. Please confirm with your physician that it is acceptable to use public transportation.   Please call the [place] [facility] at [phone] if you have any questions about these instructions.  Visitation Policy:  Patients undergoing a surgery or procedure may have one family member or support person with them as long as that person is not COVID-19 positive or experiencing its symptoms.  That person may remain in the waiting area during the procedure.  Inpatient Visitation Update:   In an effort to ensure the safety of our team members and our patients, we are implementing a change to our visitation policy:  Effective Monday, Aug. 9, at 7 a.m., inpatients will be allowed one support person.  o The support person may change daily.  o The support person must pass our screening, gel in and out, and wear a mask at all times, including in the patient's room.  o Patients must also wear a mask when staff or their support person are in the room.  o Masking is required regardless of vaccination status.  Systemwide, no visitors 17 or younger.

## 2020-02-16 ENCOUNTER — Encounter (INDEPENDENT_AMBULATORY_CARE_PROVIDER_SITE_OTHER): Payer: Medicare HMO | Admitting: Ophthalmology

## 2020-02-16 ENCOUNTER — Other Ambulatory Visit: Payer: Medicare HMO

## 2020-02-16 ENCOUNTER — Encounter
Admission: RE | Admit: 2020-02-16 | Discharge: 2020-02-16 | Disposition: A | Discharge status: Home / Self Care | Payer: Medicare HMO | Source: Ambulatory Visit | Attending: Podiatry | Admitting: Podiatry

## 2020-02-16 DIAGNOSIS — I10 Essential (primary) hypertension: Secondary | ICD-10-CM | POA: Diagnosis not present

## 2020-02-16 DIAGNOSIS — E113593 Type 2 diabetes mellitus with proliferative diabetic retinopathy without macular edema, bilateral: Secondary | ICD-10-CM

## 2020-02-16 DIAGNOSIS — H35033 Hypertensive retinopathy, bilateral: Secondary | ICD-10-CM

## 2020-02-16 DIAGNOSIS — Z01818 Encounter for other preprocedural examination: Secondary | ICD-10-CM | POA: Diagnosis not present

## 2020-02-16 DIAGNOSIS — E11319 Type 2 diabetes mellitus with unspecified diabetic retinopathy without macular edema: Secondary | ICD-10-CM

## 2020-02-16 DIAGNOSIS — H35373 Puckering of macula, bilateral: Secondary | ICD-10-CM

## 2020-02-16 DIAGNOSIS — H43813 Vitreous degeneration, bilateral: Secondary | ICD-10-CM

## 2020-02-16 LAB — CBC
HCT: 40 % (ref 39.0–52.0)
Hemoglobin: 13.6 g/dL (ref 13.0–17.0)
MCH: 29.5 pg (ref 26.0–34.0)
MCHC: 34 g/dL (ref 30.0–36.0)
MCV: 86.8 fL (ref 80.0–100.0)
Platelets: 183 10*3/uL (ref 150–400)
RBC: 4.61 MIL/uL (ref 4.22–5.81)
RDW: 13.4 % (ref 11.5–15.5)
WBC: 6.2 10*3/uL (ref 4.0–10.5)
nRBC: 0 % (ref 0.0–0.2)

## 2020-02-16 LAB — BASIC METABOLIC PANEL
Anion gap: 10 (ref 5–15)
BUN: 16 mg/dL (ref 8–23)
CO2: 26 mmol/L (ref 22–32)
Calcium: 8.5 mg/dL — ABNORMAL LOW (ref 8.9–10.3)
Chloride: 98 mmol/L (ref 98–111)
Creatinine, Ser: 0.78 mg/dL (ref 0.61–1.24)
GFR, Estimated: >60 mL/min (ref >60)
Glucose, Bld: 169 mg/dL — ABNORMAL HIGH (ref 70–99)
Potassium: 3.9 mmol/L (ref 3.5–5.1)
Sodium: 134 mmol/L — ABNORMAL LOW (ref 135–145)

## 2020-02-16 MED ORDER — CEFAZOLIN SODIUM-DEXTROSE 2-4 GM/100ML-% IV SOLN
2.0000 g | INTRAVENOUS | Status: AC
  Administered 2020-02-17: 2 g via INTRAVENOUS

## 2020-02-16 MED ORDER — CHLORHEXIDINE GLUCONATE 0.12 % MT SOLN: 15.0000 mL | Freq: Once | OROMUCOSAL | Status: AC

## 2020-02-16 MED ORDER — SODIUM CHLORIDE 0.9 % IV SOLN: INTRAVENOUS | Status: DC

## 2020-02-16 MED ORDER — POVIDONE-IODINE 10 % EX SWAB: 2.0000 "application " | Freq: Once | CUTANEOUS | Status: DC

## 2020-02-16 MED ORDER — ORAL CARE MOUTH RINSE: 15.0000 mL | Freq: Once | OROMUCOSAL | Status: AC

## 2020-02-17 ENCOUNTER — Ambulatory Visit
Admission: RE | Admit: 2020-02-17 | Discharge: 2020-02-17 | Disposition: A | Discharge status: Home / Self Care | Payer: Medicare HMO | Attending: Podiatry | Admitting: Podiatry

## 2020-02-17 ENCOUNTER — Other Ambulatory Visit: Payer: Self-pay

## 2020-02-17 ENCOUNTER — Ambulatory Visit: Payer: Medicare HMO | Admitting: Certified Registered"

## 2020-02-17 ENCOUNTER — Encounter: Payer: Self-pay | Admitting: Podiatry

## 2020-02-17 ENCOUNTER — Encounter
Admission: RE | Disposition: A | Discharge status: Home / Self Care | Payer: Self-pay | Source: Home / Self Care | Attending: Podiatry

## 2020-02-17 DIAGNOSIS — L97522 Non-pressure chronic ulcer of other part of left foot with fat layer exposed: Secondary | ICD-10-CM | POA: Insufficient documentation

## 2020-02-17 DIAGNOSIS — Z87891 Personal history of nicotine dependence: Secondary | ICD-10-CM | POA: Insufficient documentation

## 2020-02-17 DIAGNOSIS — Z79899 Other long term (current) drug therapy: Secondary | ICD-10-CM | POA: Diagnosis not present

## 2020-02-17 DIAGNOSIS — Z794 Long term (current) use of insulin: Secondary | ICD-10-CM | POA: Insufficient documentation

## 2020-02-17 DIAGNOSIS — E669 Obesity, unspecified: Secondary | ICD-10-CM | POA: Insufficient documentation

## 2020-02-17 DIAGNOSIS — E11621 Type 2 diabetes mellitus with foot ulcer: Secondary | ICD-10-CM | POA: Diagnosis not present

## 2020-02-17 DIAGNOSIS — E1142 Type 2 diabetes mellitus with diabetic polyneuropathy: Secondary | ICD-10-CM | POA: Diagnosis not present

## 2020-02-17 DIAGNOSIS — E1169 Type 2 diabetes mellitus with other specified complication: Secondary | ICD-10-CM | POA: Diagnosis not present

## 2020-02-17 DIAGNOSIS — I10 Essential (primary) hypertension: Secondary | ICD-10-CM | POA: Insufficient documentation

## 2020-02-17 DIAGNOSIS — Z6829 Body mass index (BMI) 29.0-29.9, adult: Secondary | ICD-10-CM | POA: Insufficient documentation

## 2020-02-17 DIAGNOSIS — M869 Osteomyelitis, unspecified: Secondary | ICD-10-CM | POA: Insufficient documentation

## 2020-02-17 HISTORY — PX: AMPUTATION TOE: SHX6595

## 2020-02-17 LAB — GLUCOSE, CAPILLARY
Glucose-Capillary: 112 mg/dL — ABNORMAL HIGH (ref 70–99)
Glucose-Capillary: 176 mg/dL — ABNORMAL HIGH (ref 70–99)

## 2020-02-17 LAB — SARS CORONAVIRUS 2 (TAT 6-24 HRS): SARS Coronavirus 2: NEGATIVE

## 2020-02-17 SURGERY — AMPUTATION, TOE
Anesthesia: General | Site: Toe | Laterality: Left

## 2020-02-17 MED ORDER — FENTANYL CITRATE (PF) 100 MCG/2ML IJ SOLN: 25.0000 ug | INTRAMUSCULAR | Status: DC | PRN

## 2020-02-17 MED ORDER — PROPOFOL 10 MG/ML IV BOLUS
INTRAVENOUS | Status: AC
  Filled 2020-02-17: qty 20

## 2020-02-17 MED ORDER — FENTANYL CITRATE (PF) 100 MCG/2ML IJ SOLN
INTRAMUSCULAR | Status: AC
  Filled 2020-02-17: qty 2

## 2020-02-17 MED ORDER — HYDROCODONE-ACETAMINOPHEN 5-325 MG PO TABS: 1.0000 | ORAL_TABLET | ORAL | 0 refills | Status: DC | PRN

## 2020-02-17 MED ORDER — PROMETHAZINE HCL 25 MG/ML IJ SOLN: 6.2500 mg | INTRAMUSCULAR | Status: DC | PRN

## 2020-02-17 MED ORDER — CEFAZOLIN SODIUM-DEXTROSE 2-4 GM/100ML-% IV SOLN
INTRAVENOUS | Status: AC
  Filled 2020-02-17: qty 100

## 2020-02-17 MED ORDER — BUPIVACAINE HCL 0.5 % IJ SOLN
INTRAMUSCULAR | Status: DC | PRN
  Administered 2020-02-17: 18 mL

## 2020-02-17 MED ORDER — ACETAMINOPHEN 10 MG/ML IV SOLN
INTRAVENOUS | Status: DC | PRN
  Administered 2020-02-17: 1000 mg via INTRAVENOUS

## 2020-02-17 MED ORDER — MIDAZOLAM HCL 2 MG/2ML IJ SOLN
INTRAMUSCULAR | Status: DC | PRN
  Administered 2020-02-17: 2 mg via INTRAVENOUS

## 2020-02-17 MED ORDER — LIDOCAINE HCL (CARDIAC) PF 100 MG/5ML IV SOSY
PREFILLED_SYRINGE | INTRAVENOUS | Status: DC | PRN
  Administered 2020-02-17: 60 mg via INTRAVENOUS

## 2020-02-17 MED ORDER — CHLORHEXIDINE GLUCONATE 0.12 % MT SOLN
OROMUCOSAL | Status: AC
  Administered 2020-02-17: 15 mL via OROMUCOSAL
  Filled 2020-02-17: qty 15

## 2020-02-17 MED ORDER — MIDAZOLAM HCL 2 MG/2ML IJ SOLN
INTRAMUSCULAR | Status: AC
  Filled 2020-02-17: qty 2

## 2020-02-17 MED ORDER — FENTANYL CITRATE (PF) 100 MCG/2ML IJ SOLN
INTRAMUSCULAR | Status: DC | PRN
  Administered 2020-02-17 (×2): 25 ug via INTRAVENOUS
  Administered 2020-02-17: 50 ug via INTRAVENOUS

## 2020-02-17 MED ORDER — ACETAMINOPHEN 10 MG/ML IV SOLN
INTRAVENOUS | Status: AC
  Filled 2020-02-17: qty 100

## 2020-02-17 MED ORDER — PROPOFOL 10 MG/ML IV BOLUS
INTRAVENOUS | Status: DC | PRN
  Administered 2020-02-17: 180 mg via INTRAVENOUS

## 2020-02-17 MED ORDER — ONDANSETRON HCL 4 MG/2ML IJ SOLN
INTRAMUSCULAR | Status: AC
  Filled 2020-02-17: qty 2

## 2020-02-17 MED ORDER — FAMOTIDINE 20 MG PO TABS
ORAL_TABLET | ORAL | Status: AC
  Administered 2020-02-17: 20 mg
  Filled 2020-02-17: qty 1

## 2020-02-17 MED ORDER — ONDANSETRON HCL 4 MG/2ML IJ SOLN
INTRAMUSCULAR | Status: DC | PRN
  Administered 2020-02-17: 4 mg via INTRAVENOUS

## 2020-02-17 SURGICAL SUPPLY — 47 items
BLADE MED AGGRESSIVE (BLADE) IMPLANT
BLADE OSC/SAGITTAL MD 5.5X18 (BLADE) ×2 IMPLANT
BLADE SURG 15 STRL LF DISP TIS (BLADE) ×2 IMPLANT
BLADE SURG 15 STRL SS (BLADE) ×4
BLADE SURG MINI STRL (BLADE) ×2 IMPLANT
BNDG CONFORM 2 STRL LF (GAUZE/BANDAGES/DRESSINGS) ×2 IMPLANT
BNDG ELASTIC 4X5.8 VLCR STR LF (GAUZE/BANDAGES/DRESSINGS) ×2 IMPLANT
BNDG ESMARK 4X12 TAN STRL LF (GAUZE/BANDAGES/DRESSINGS) ×2 IMPLANT
BNDG GAUZE 4.5X4.1 6PLY STRL (MISCELLANEOUS) ×2 IMPLANT
CANISTER SUCT 1200ML W/VALVE (MISCELLANEOUS) ×2 IMPLANT
COVER WAND RF STERILE (DRAPES) ×2 IMPLANT
CUFF TOURN DUAL PL 12 NO SLV (MISCELLANEOUS) IMPLANT
CUFF TOURN SGL QUICK 18X4 (TOURNIQUET CUFF) ×2 IMPLANT
DRAPE FLUOR MINI C-ARM 54X84 (DRAPES) ×2 IMPLANT
DURAPREP 26ML APPLICATOR (WOUND CARE) ×2 IMPLANT
ELECT REM PT RETURN 9FT ADLT (ELECTROSURGICAL) ×2
ELECTRODE REM PT RTRN 9FT ADLT (ELECTROSURGICAL) ×1 IMPLANT
GAUZE SPONGE 4X4 12PLY STRL (GAUZE/BANDAGES/DRESSINGS) ×2 IMPLANT
GAUZE XEROFORM 1X8 LF (GAUZE/BANDAGES/DRESSINGS) ×2 IMPLANT
GLOVE BIO SURGEON STRL SZ7.5 (GLOVE) ×2 IMPLANT
GLOVE INDICATOR 8.0 STRL GRN (GLOVE) ×2 IMPLANT
GOWN STRL REUS W/ TWL LRG LVL3 (GOWN DISPOSABLE) ×2 IMPLANT
GOWN STRL REUS W/TWL LRG LVL3 (GOWN DISPOSABLE) ×4
HANDPIECE VERSAJET DEBRIDEMENT (MISCELLANEOUS) IMPLANT
KIT TURNOVER KIT A (KITS) ×2 IMPLANT
LABEL OR SOLS (LABEL) ×2 IMPLANT
MANIFOLD NEPTUNE II (INSTRUMENTS) ×2 IMPLANT
NEEDLE FILTER BLUNT 18X 1/2SAF (NEEDLE) ×1
NEEDLE FILTER BLUNT 18X1 1/2 (NEEDLE) ×1 IMPLANT
NEEDLE HYPO 25X1 1.5 SAFETY (NEEDLE) ×4 IMPLANT
NS IRRIG 500ML POUR BTL (IV SOLUTION) ×2 IMPLANT
PACK EXTREMITY (MISCELLANEOUS) ×2 IMPLANT
SOL .9 NS 3000ML IRR  AL (IV SOLUTION) ×2
SOL .9 NS 3000ML IRR AL (IV SOLUTION) ×1
SOL .9 NS 3000ML IRR UROMATIC (IV SOLUTION) ×1 IMPLANT
SOL PREP PVP 2OZ (MISCELLANEOUS) ×2
SOLUTION PREP PVP 2OZ (MISCELLANEOUS) ×1 IMPLANT
STOCKINETTE BIAS CUT 4 980044 (GAUZE/BANDAGES/DRESSINGS) IMPLANT
STOCKINETTE STRL 6IN 960660 (GAUZE/BANDAGES/DRESSINGS) ×2 IMPLANT
STRIP CLOSURE SKIN 1/4X4 (GAUZE/BANDAGES/DRESSINGS) IMPLANT
SUT ETHILON 3-0 FS-10 30 BLK (SUTURE) ×2
SUT ETHILON 4-0 (SUTURE)
SUT ETHILON 4-0 FS2 18XMFL BLK (SUTURE)
SUTURE EHLN 3-0 FS-10 30 BLK (SUTURE) ×1 IMPLANT
SUTURE ETHLN 4-0 FS2 18XMF BLK (SUTURE) IMPLANT
SWAB DUAL CULTURE TRANS RED ST (MISCELLANEOUS) IMPLANT
SYR 10ML LL (SYRINGE) ×2 IMPLANT

## 2020-02-17 NOTE — Discharge Instructions (Addendum)
1.  Elevate the left lower extremity on pillows.  2.  Keep the bandage on the left foot clean, dry, and do not remove.  3.  Sponge bathe only left lower extremity.  4.  Wear surgical shoe on the left foot whenever walking or standing.  5.  Take 1 pain pill, Norco, every 4 hours only if needed for pain.   AMBULATORY SURGERY  DISCHARGE INSTRUCTIONS   1) The drugs that you were given will stay in your system until tomorrow so for the next 24 hours you should not:  A) Drive an automobile B) Make any legal decisions C) Drink any alcoholic beverage   2) You may resume regular meals tomorrow.  Today it is better to start with liquids and gradually work up to solid foods.  You may eat anything you prefer, but it is better to start with liquids, then soup and crackers, and gradually work up to solid foods.   3) Please notify your doctor immediately if you have any unusual bleeding, trouble breathing, redness and pain at the surgery site, drainage, fever, or pain not relieved by medication.    4) Additional Instructions:        Please contact your physician with any problems or Same Day Surgery at [phone], Monday through Friday 6 am to 4 pm, or [place] at [facility] number at [phone].

## 2020-02-17 NOTE — Transfer of Care (Signed)
Immediate Anesthesia Transfer of Care Note  Patient: Dillon Henry  Procedure(s) Performed: AMPUTATION TOE MPJ LEFT (Left Toe)  Patient Location: PACU  Anesthesia Type:General  Level of Consciousness: drowsy  Airway & Oxygen Therapy: Patient Spontanous Breathing and Patient connected to face mask oxygen  Post-op Assessment: Report given to RN  Post vital signs: stable  Last Vitals:  Vitals Value Taken Time  BP 139/91 02/17/20 0815  Temp    Pulse 71 02/17/20 0817  Resp 9 02/17/20 0817  SpO2 100 % 02/17/20 0817  Vitals shown include unvalidated device data.  Last Pain:  Vitals:   02/17/20 0355  TempSrc: Temporal  PainSc: 0-No pain         Complications: No complications documented.

## 2020-02-17 NOTE — Anesthesia Preprocedure Evaluation (Signed)
Anesthesia Evaluation  Patient identified by MRN, date of birth, ID band Patient awake    Reviewed: Allergy & Precautions, NPO status , Patient's Chart, lab work & pertinent test results  History of Anesthesia Complications Negative for: history of anesthetic complications  Airway Mallampati: II  TM Distance: >3 FB Neck ROM: Full    Dental  (+) Missing, Dental Advidsory Given, Caps, Partial Upper, Partial Lower   Pulmonary neg sleep apnea, neg COPD, former smoker,    breath sounds clear to auscultation- rhonchi (-) wheezing      Cardiovascular Exercise Tolerance: Good hypertension, Pt. on medications (-) angina(-) CAD, (-) Past MI and (-) Cardiac Stents + dysrhythmias (hx of ablation) Atrial Fibrillation (-) Valvular Problems/Murmurs Rhythm:Regular Rate:Normal - Systolic murmurs and - Diastolic murmurs    Neuro/Psych neg Seizures negative neurological ROS  negative psych ROS   GI/Hepatic negative GI ROS, Neg liver ROS,   Endo/Other  diabetes, Insulin Dependent  Renal/GU negative Renal ROS     Musculoskeletal negative musculoskeletal ROS (+)   Abdominal (+) + obese,   Peds  Hematology negative hematology ROS (+)   Anesthesia Other Findings Past Medical History: 1994: Diabetes mellitus     Comment:  Type II 05/1997: Hyperlipidemia 1995: Hypertension   Reproductive/Obstetrics                             Anesthesia Physical  Anesthesia Plan  ASA: III  Anesthesia Plan: General   Post-op Pain Management:    Induction: Intravenous  PONV Risk Score and Plan: 1 and Ondansetron, Dexamethasone, Midazolam and Promethazine  Airway Management Planned: LMA  Additional Equipment:   Intra-op Plan:   Post-operative Plan: Extubation in OR  Informed Consent: I have reviewed the patients History and Physical, chart, labs and discussed the procedure including the risks, benefits and  alternatives for the proposed anesthesia with the patient or authorized representative who has indicated his/her understanding and acceptance.     Dental advisory given  Plan Discussed with: CRNA and Anesthesiologist  Anesthesia Plan Comments:         Anesthesia Quick Evaluation

## 2020-02-17 NOTE — H&P (Signed)
Patient presents today for amputation of his left great toe.  Recent ulceration which has progressed down to the level of bone.  Objective: Vascular status intact.  Some loss of protective threshold with a monofilament wire.  Hallux malleus contracture on the left great toe with full-thickness ulceration probing to bone at the IPJ.  Mild surrounding erythema and drainage.  X-rays showed evidence for bony erosion at the head of the proximal phalanx.  Assessment: Osteomyelitis left hallux.  Plan: Medical history and physical in the chart was reviewed.  Patient stable for surgery for amputation of his left great toe.

## 2020-02-17 NOTE — Anesthesia Procedure Notes (Signed)
Procedure Name: LMA Insertion Date/Time: 02/17/2020 7:31 AM Performed by: Lerry Liner, CRNA Pre-anesthesia Checklist: Patient identified, Emergency Drugs available, Suction available and Patient being monitored Patient Re-evaluated:Patient Re-evaluated prior to induction Oxygen Delivery Method: Circle system utilized Preoxygenation: Pre-oxygenation with 100% oxygen Induction Type: IV induction Ventilation: Mask ventilation without difficulty LMA: LMA inserted LMA Size: 5.0 Number of attempts: 1 Tube secured with: Tape Dental Injury: Teeth and Oropharynx as per pre-operative assessment

## 2020-02-17 NOTE — Op Note (Signed)
Date of operation: 02/17/2020.  Surgeon: Durward Fortes D.P.M.  Preoperative diagnosis: Osteomyelitis left hallux.  Postoperative diagnosis: Same.  Procedure: Amputation left great toe.  Anesthesia: LMA.  Hemostasis: Pneumatic tourniquet left ankle 250 mmHg.  Estimated blood loss: Less than 5 cc.  Injectables: 8 cc 0.5% Marcaine plain.  Pathology: Left great toe.  Complications: None apparent.  Operative indications: This is a 69 year old male with recent development of ulceration on his left great toe.  Ulceration progressed down to the level of bone with x-ray changes on x-ray and decision was made for curative amputation of the great toe.  Operative procedure: Patient was taken the operating room and placed on the table in the supine position.  Following satisfactory LMA anesthesia a pneumatic tourniquet was applied at the level of the left ankle.  The foot was anesthetized with 0.5% Marcaine plain approximately 8 cc.  The foot was then prepped and draped in usual sterile fashion.  The foot was exsanguinated and the tourniquet inflated to 250 mmHg. Attention was then directed to the distal aspect of the left hallux where a fishmouth type incision was made over the dorsal and plantar aspect of the toe connecting medial and lateral.  Incision carried down to the level of the bone where periosteal dissection was carried proximally and the toe was then incised using a sagittal saw.  The distal aspect of the toe was removed.  The wound was flushed with copious amounts of sterile saline and closed using 3-0 nylon vertical mattress and simple interrupted sutures.  Xeroform 4 x 4's and con form applied to the left foot.  Tourniquet was released.  Kerlix and Ace wrap then applied.  Patient was awakened and transported to the PACU with vital signs stable and in good condition.

## 2020-02-17 NOTE — Interval H&P Note (Signed)
History and Physical Interval Note:  02/17/2020 7:05 AM  Dillon Henry  has presented today for surgery, with the diagnosis of M86.172 OSTEOMYLITIS.  The various methods of treatment have been discussed with the patient and family. After consideration of risks, benefits and other options for treatment, the patient has consented to  Procedure(s): AMPUTATION TOE MPJ LEFT (Left) as a surgical intervention.  The patient's history has been reviewed, patient examined, no change in status, stable for surgery.  I have reviewed the patient's chart and labs.  Questions were answered to the patient's satisfaction.     Durward Fortes

## 2020-02-17 NOTE — Anesthesia Postprocedure Evaluation (Signed)
Anesthesia Post Note  Patient: Dillon Henry  Procedure(s) Performed: AMPUTATION TOE MPJ LEFT (Left Toe)  Patient location during evaluation: PACU Anesthesia Type: General Level of consciousness: awake and alert Pain management: pain level controlled Vital Signs Assessment: post-procedure vital signs reviewed and stable Respiratory status: spontaneous breathing, nonlabored ventilation, respiratory function stable and patient connected to nasal cannula oxygen Cardiovascular status: blood pressure returned to baseline and stable Postop Assessment: no apparent nausea or vomiting Anesthetic complications: no   No complications documented.   Last Vitals:  Vitals:   02/17/20 0844 02/17/20 0857  BP: 136/77 (!) 162/79  Pulse: 70 64  Resp: 11 14  Temp: (!) 36.4 C (!) 36.3 C  SpO2: 98% 100%    Last Pain:  Vitals:   02/17/20 0857  TempSrc: Temporal  PainSc: 0-No pain                 Martha Clan

## 2020-02-17 NOTE — OR Nursing (Signed)
Per Dr. Cleda Mccreedy, telephone, patient may resume taking aspirin today.  Added to d//c instructions-medication section.

## 2020-02-21 LAB — SURGICAL PATHOLOGY

## 2020-08-15 ENCOUNTER — Encounter (INDEPENDENT_AMBULATORY_CARE_PROVIDER_SITE_OTHER): Payer: Medicare HMO | Admitting: Ophthalmology

## 2020-08-15 ENCOUNTER — Other Ambulatory Visit: Payer: Self-pay

## 2020-08-15 DIAGNOSIS — E113593 Type 2 diabetes mellitus with proliferative diabetic retinopathy without macular edema, bilateral: Secondary | ICD-10-CM

## 2020-08-15 DIAGNOSIS — I10 Essential (primary) hypertension: Secondary | ICD-10-CM

## 2020-08-15 DIAGNOSIS — H43813 Vitreous degeneration, bilateral: Secondary | ICD-10-CM

## 2020-08-15 DIAGNOSIS — H35371 Puckering of macula, right eye: Secondary | ICD-10-CM

## 2020-08-15 DIAGNOSIS — H35033 Hypertensive retinopathy, bilateral: Secondary | ICD-10-CM

## 2020-09-13 ENCOUNTER — Other Ambulatory Visit: Payer: Self-pay | Admitting: Orthopedic Surgery

## 2020-09-25 ENCOUNTER — Encounter
Admission: RE | Admit: 2020-09-25 | Discharge: 2020-09-25 | Disposition: A | Discharge status: Home / Self Care | Payer: Medicare HMO | Source: Ambulatory Visit | Attending: Orthopedic Surgery | Admitting: Orthopedic Surgery

## 2020-09-25 ENCOUNTER — Other Ambulatory Visit: Payer: Self-pay

## 2020-09-25 DIAGNOSIS — I493 Ventricular premature depolarization: Secondary | ICD-10-CM | POA: Diagnosis not present

## 2020-09-25 DIAGNOSIS — Z01810 Encounter for preprocedural cardiovascular examination: Secondary | ICD-10-CM | POA: Diagnosis not present

## 2020-09-25 DIAGNOSIS — Z01818 Encounter for other preprocedural examination: Secondary | ICD-10-CM | POA: Diagnosis not present

## 2020-09-25 LAB — URINALYSIS, ROUTINE W REFLEX MICROSCOPIC
Bilirubin Urine: NEGATIVE
Glucose, UA: 50 mg/dL — AB
Hgb urine dipstick: NEGATIVE
Ketones, ur: NEGATIVE mg/dL
Leukocytes,Ua: NEGATIVE
Nitrite: NEGATIVE
Protein, ur: NEGATIVE mg/dL
Specific Gravity, Urine: 1.017 (ref 1.005–1.030)
pH: 6 (ref 5.0–8.0)

## 2020-09-25 LAB — CBC WITH DIFFERENTIAL/PLATELET
Abs Immature Granulocytes: 0.03 10*3/uL (ref 0.00–0.07)
Basophils Absolute: 0 10*3/uL (ref 0.0–0.1)
Basophils Relative: 1 %
Eosinophils Absolute: 0.1 10*3/uL (ref 0.0–0.5)
Eosinophils Relative: 1 %
HCT: 38.5 % — ABNORMAL LOW (ref 39.0–52.0)
Hemoglobin: 13.4 g/dL (ref 13.0–17.0)
Immature Granulocytes: 0 %
Lymphocytes Relative: 37 %
Lymphs Abs: 3.2 10*3/uL (ref 0.7–4.0)
MCH: 30.1 pg (ref 26.0–34.0)
MCHC: 34.8 g/dL (ref 30.0–36.0)
MCV: 86.5 fL (ref 80.0–100.0)
Monocytes Absolute: 0.7 10*3/uL (ref 0.1–1.0)
Monocytes Relative: 8 %
Neutro Abs: 4.6 10*3/uL (ref 1.7–7.7)
Neutrophils Relative %: 53 %
Platelets: 204 10*3/uL (ref 150–400)
RBC: 4.45 MIL/uL (ref 4.22–5.81)
RDW: 13.5 % (ref 11.5–15.5)
WBC: 8.8 10*3/uL (ref 4.0–10.5)
nRBC: 0 % (ref 0.0–0.2)

## 2020-09-25 LAB — COMPREHENSIVE METABOLIC PANEL
ALT: 15 U/L (ref 0–44)
AST: 20 U/L (ref 15–41)
Albumin: 4.4 g/dL (ref 3.5–5.0)
Alkaline Phosphatase: 48 U/L (ref 38–126)
Anion gap: 8 (ref 5–15)
BUN: 22 mg/dL (ref 8–23)
CO2: 25 mmol/L (ref 22–32)
Calcium: 9.1 mg/dL (ref 8.9–10.3)
Chloride: 102 mmol/L (ref 98–111)
Creatinine, Ser: 0.76 mg/dL (ref 0.61–1.24)
GFR, Estimated: >60 mL/min (ref >60)
Glucose, Bld: 61 mg/dL — ABNORMAL LOW (ref 70–99)
Potassium: 3.6 mmol/L (ref 3.5–5.1)
Sodium: 135 mmol/L (ref 135–145)
Total Bilirubin: 1 mg/dL (ref 0.3–1.2)
Total Protein: 7.2 g/dL (ref 6.5–8.1)

## 2020-09-25 LAB — TYPE AND SCREEN
ABO/RH(D): A POS
Antibody Screen: NEGATIVE

## 2020-09-25 LAB — SURGICAL PCR SCREEN
MRSA, PCR: NEGATIVE
Staphylococcus aureus: NEGATIVE

## 2020-09-25 NOTE — Patient Instructions (Signed)
Your procedure is scheduled on: 10/09/20 Report to [place]. To find out your arrival time please call [phone] between 1PM - 3PM on 10/08/20.  Remember: Instructions that are not followed completely may result in serious medical risk, up to and including death, or upon the discretion of your surgeon and anesthesiologist your surgery may need to be rescheduled.     _X__ 1. Do not eat food after midnight the night before your procedure.                 No gum chewing or hard candies. You may drink clear liquids up to 2 hours                 before you are scheduled to arrive for your surgery-                   Diabetics water only  Drink the G2 Gatorade 2 hours before arriving for surgery. If you are a 6 am arrival drink by 4:30 am.  __X__2.  On the morning of surgery brush your teeth with toothpaste and water, you                 may rinse your mouth with mouthwash if you wish.  Do not swallow any              toothpaste of mouthwash.     _X__ 3.  No Alcohol for 24 hours before or after surgery.   _X__ 4.  Do Not Smoke or use e-cigarettes For 24 Hours Prior to Your Surgery.                 Do not use any chewable tobacco products for at least 6 hours prior to                 surgery.  ____  5.  Bring all medications with you on the day of surgery if instructed.   __X__  6.  Notify your doctor if there is any change in your medical condition      (cold, fever, infections).     Do not wear jewelry, make-up, hairpins, clips or nail polish. Do not wear lotions, powders, or perfumes.  Do not shave body hair 48 hours prior to surgery. Men may shave face and neck. Do not bring valuables to the hospital.    [facility] is not responsible for any belongings or valuables.  Contacts, dentures/partials or body piercings may not be worn into surgery. Bring a case for your contacts, glasses or hearing aids, a denture cup will be  supplied. Leave your suitcase in the car. After surgery it may be brought to your room. For patients admitted to the hospital, discharge time is determined by your treatment team.   Patients discharged the day of surgery will not be allowed to drive home.   Please read over the following fact sheets that you were given:   MRSA Information, CHG soap, Incentive spirometer  __X__ Take these medicines the morning of surgery with A SIP OF WATER:    1. none  2.   3.   4.  5.  6.  ____ Fleet Enema (as directed)   __X__ Use CHG Soap/SAGE wipes as directed  ____ Use inhalers on the day of surgery  __X__ Stop metformin/Janumet/Farxiga 2 days prior to surgery    __X__ Take 1/2 of usual insulin dose the night before  surgery. No insulin the morning          of surgery.   ____ Stop Blood Thinners Coumadin/Plavix/Xarelto/Pleta/Pradaxa/Eliquis/Effient/Aspirin  on   Or contact your Surgeon, Cardiologist or Medical Doctor regarding  ability to stop your blood thinners  __X__ Stop Anti-inflammatories 7 days before surgery such as Advil, Ibuprofen, Motrin,  BC or Goodies Powder, Naprosyn, Naproxen, Aleve, Aspirin    __X__ Stop all herbal supplements, fish oil or vitamin E until after surgery.  Hold Zinc 7 days prior to surgery  ____ Bring C-Pap to the hospital.

## 2020-10-05 ENCOUNTER — Other Ambulatory Visit (HOSPITAL_COMMUNITY): Payer: Self-pay | Admitting: Family Medicine

## 2020-10-05 ENCOUNTER — Other Ambulatory Visit: Payer: Self-pay

## 2020-10-05 ENCOUNTER — Other Ambulatory Visit
Admission: RE | Admit: 2020-10-05 | Discharge: 2020-10-05 | Disposition: A | Discharge status: Home / Self Care | Payer: Medicare HMO | Source: Ambulatory Visit | Attending: Orthopedic Surgery | Admitting: Orthopedic Surgery

## 2020-10-05 ENCOUNTER — Other Ambulatory Visit: Payer: Self-pay | Admitting: Family Medicine

## 2020-10-05 DIAGNOSIS — Z136 Encounter for screening for cardiovascular disorders: Secondary | ICD-10-CM

## 2020-10-05 DIAGNOSIS — Z01812 Encounter for preprocedural laboratory examination: Secondary | ICD-10-CM | POA: Insufficient documentation

## 2020-10-05 DIAGNOSIS — Z87891 Personal history of nicotine dependence: Secondary | ICD-10-CM

## 2020-10-05 DIAGNOSIS — E782 Mixed hyperlipidemia: Secondary | ICD-10-CM

## 2020-10-05 DIAGNOSIS — Z20822 Contact with and (suspected) exposure to covid-19: Secondary | ICD-10-CM | POA: Diagnosis not present

## 2020-10-05 LAB — SARS CORONAVIRUS 2 (TAT 6-24 HRS): SARS Coronavirus 2: NEGATIVE

## 2020-10-09 ENCOUNTER — Other Ambulatory Visit: Payer: Self-pay

## 2020-10-09 ENCOUNTER — Inpatient Hospital Stay: Payer: Medicare HMO | Admitting: Certified Registered"

## 2020-10-09 ENCOUNTER — Encounter: Payer: Self-pay | Admitting: Orthopedic Surgery

## 2020-10-09 ENCOUNTER — Inpatient Hospital Stay
Admission: RE | Admit: 2020-10-09 | Discharge: 2020-10-12 | DRG: 470 | Disposition: A | Discharge status: Home Health | Payer: Medicare HMO | Attending: Orthopedic Surgery | Admitting: Orthopedic Surgery

## 2020-10-09 ENCOUNTER — Inpatient Hospital Stay: Payer: Medicare HMO

## 2020-10-09 ENCOUNTER — Encounter
Admission: RE | Disposition: A | Discharge status: Home Health | Payer: Self-pay | Source: Home / Self Care | Attending: Orthopedic Surgery

## 2020-10-09 DIAGNOSIS — E11610 Type 2 diabetes mellitus with diabetic neuropathic arthropathy: Secondary | ICD-10-CM | POA: Diagnosis present

## 2020-10-09 DIAGNOSIS — H903 Sensorineural hearing loss, bilateral: Secondary | ICD-10-CM | POA: Diagnosis present

## 2020-10-09 DIAGNOSIS — E11319 Type 2 diabetes mellitus with unspecified diabetic retinopathy without macular edema: Secondary | ICD-10-CM | POA: Diagnosis present

## 2020-10-09 DIAGNOSIS — G8918 Other acute postprocedural pain: Secondary | ICD-10-CM

## 2020-10-09 DIAGNOSIS — Z8249 Family history of ischemic heart disease and other diseases of the circulatory system: Secondary | ICD-10-CM

## 2020-10-09 DIAGNOSIS — Z806 Family history of leukemia: Secondary | ICD-10-CM

## 2020-10-09 DIAGNOSIS — I4891 Unspecified atrial fibrillation: Secondary | ICD-10-CM | POA: Diagnosis present

## 2020-10-09 DIAGNOSIS — Z833 Family history of diabetes mellitus: Secondary | ICD-10-CM

## 2020-10-09 DIAGNOSIS — Z96651 Presence of right artificial knee joint: Secondary | ICD-10-CM

## 2020-10-09 DIAGNOSIS — Z888 Allergy status to other drugs, medicaments and biological substances status: Secondary | ICD-10-CM | POA: Diagnosis not present

## 2020-10-09 DIAGNOSIS — M1711 Unilateral primary osteoarthritis, right knee: Principal | ICD-10-CM | POA: Diagnosis present

## 2020-10-09 DIAGNOSIS — E785 Hyperlipidemia, unspecified: Secondary | ICD-10-CM | POA: Diagnosis present

## 2020-10-09 DIAGNOSIS — Z7984 Long term (current) use of oral hypoglycemic drugs: Secondary | ICD-10-CM

## 2020-10-09 DIAGNOSIS — E1142 Type 2 diabetes mellitus with diabetic polyneuropathy: Secondary | ICD-10-CM | POA: Diagnosis present

## 2020-10-09 DIAGNOSIS — Z794 Long term (current) use of insulin: Secondary | ICD-10-CM

## 2020-10-09 DIAGNOSIS — Z800 Family history of malignant neoplasm of digestive organs: Secondary | ICD-10-CM

## 2020-10-09 DIAGNOSIS — Z79899 Other long term (current) drug therapy: Secondary | ICD-10-CM

## 2020-10-09 DIAGNOSIS — I10 Essential (primary) hypertension: Secondary | ICD-10-CM | POA: Diagnosis present

## 2020-10-09 HISTORY — PX: TOTAL KNEE ARTHROPLASTY: SHX125

## 2020-10-09 LAB — GLUCOSE, CAPILLARY
Glucose-Capillary: 179 mg/dL — ABNORMAL HIGH (ref 70–99)
Glucose-Capillary: 169 mg/dL — ABNORMAL HIGH (ref 70–99)
Glucose-Capillary: 159 mg/dL — ABNORMAL HIGH (ref 70–99)
Glucose-Capillary: 207 mg/dL — ABNORMAL HIGH (ref 70–99)
Glucose-Capillary: 258 mg/dL — ABNORMAL HIGH (ref 70–99)

## 2020-10-09 LAB — CBC
HCT: 37.5 % — ABNORMAL LOW (ref 39.0–52.0)
Hemoglobin: 12.6 g/dL — ABNORMAL LOW (ref 13.0–17.0)
MCH: 29.8 pg (ref 26.0–34.0)
MCHC: 33.6 g/dL (ref 30.0–36.0)
MCV: 88.7 fL (ref 80.0–100.0)
Platelets: 174 10*3/uL (ref 150–400)
RBC: 4.23 MIL/uL (ref 4.22–5.81)
RDW: 13.7 % (ref 11.5–15.5)
WBC: 8 10*3/uL (ref 4.0–10.5)
nRBC: 0 % (ref 0.0–0.2)

## 2020-10-09 LAB — CREATININE, SERUM
Creatinine, Ser: 0.73 mg/dL (ref 0.61–1.24)
GFR, Estimated: >60 mL/min (ref >60)

## 2020-10-09 SURGERY — ARTHROPLASTY, KNEE, TOTAL
Anesthesia: Spinal | Site: Knee | Laterality: Right

## 2020-10-09 MED ORDER — ZOLPIDEM TARTRATE 5 MG PO TABS
5.0000 mg | ORAL_TABLET | Freq: Every evening | ORAL | Status: DC | PRN
Start: 2020-10-09 — End: 2020-10-12

## 2020-10-09 MED ORDER — METOCLOPRAMIDE HCL 10 MG PO TABS
5.0000 mg | ORAL_TABLET | Freq: Three times a day (TID) | ORAL | Status: DC | PRN
Start: 2020-10-09 — End: 2020-10-12

## 2020-10-09 MED ORDER — ONDANSETRON HCL 4 MG PO TABS: 4.0000 mg | ORAL_TABLET | Freq: Four times a day (QID) | ORAL | Status: DC | PRN

## 2020-10-09 MED ORDER — CEFAZOLIN SODIUM-DEXTROSE 2-4 GM/100ML-% IV SOLN
2.0000 g | INTRAVENOUS | Status: AC
  Administered 2020-10-09: 2 g via INTRAVENOUS

## 2020-10-09 MED ORDER — PRAVASTATIN SODIUM 20 MG PO TABS
20.0000 mg | ORAL_TABLET | Freq: Every day | ORAL | Status: DC
  Administered 2020-10-09 – 2020-10-11 (×3): 20 mg via ORAL
  Filled 2020-10-09 (×3): qty 1

## 2020-10-09 MED ORDER — CHLORHEXIDINE GLUCONATE 0.12 % MT SOLN
OROMUCOSAL | Status: AC
  Filled 2020-10-09: qty 15

## 2020-10-09 MED ORDER — METHOCARBAMOL 1000 MG/10ML IJ SOLN
500.0000 mg | Freq: Four times a day (QID) | INTRAVENOUS | Status: DC | PRN
  Filled 2020-10-09: qty 5

## 2020-10-09 MED ORDER — MIDAZOLAM HCL 2 MG/2ML IJ SOLN
INTRAMUSCULAR | Status: AC
  Filled 2020-10-09: qty 2

## 2020-10-09 MED ORDER — PHENOL 1.4 % MT LIQD
1.0000 | OROMUCOSAL | Status: DC | PRN
  Filled 2020-10-09: qty 177

## 2020-10-09 MED ORDER — BUPIVACAINE-EPINEPHRINE (PF) 0.25% -1:200000 IJ SOLN
INTRAMUSCULAR | Status: AC
  Filled 2020-10-09: qty 30

## 2020-10-09 MED ORDER — MORPHINE SULFATE (PF) 10 MG/ML IV SOLN
INTRAVENOUS | Status: DC | PRN
  Administered 2020-10-09: 10 mg

## 2020-10-09 MED ORDER — SODIUM CHLORIDE 0.9 % IV SOLN: INTRAVENOUS | Status: DC

## 2020-10-09 MED ORDER — GLIPIZIDE 5 MG PO TABS
10.0000 mg | ORAL_TABLET | Freq: Every day | ORAL | Status: DC
  Administered 2020-10-10 – 2020-10-12 (×3): 10 mg via ORAL
  Filled 2020-10-09 (×3): qty 2

## 2020-10-09 MED ORDER — ZINC SULFATE 220 (50 ZN) MG PO CAPS
220.0000 mg | ORAL_CAPSULE | Freq: Every day | ORAL | Status: DC
  Administered 2020-10-09 – 2020-10-12 (×4): 220 mg via ORAL
  Filled 2020-10-09 (×4): qty 1

## 2020-10-09 MED ORDER — BUPIVACAINE HCL (PF) 0.5 % IJ SOLN
INTRAMUSCULAR | Status: DC | PRN
  Administered 2020-10-09: 2.5 mL

## 2020-10-09 MED ORDER — ORAL CARE MOUTH RINSE: 15.0000 mL | Freq: Once | OROMUCOSAL | Status: AC

## 2020-10-09 MED ORDER — HYDROCODONE-ACETAMINOPHEN 5-325 MG PO TABS
1.0000 | ORAL_TABLET | ORAL | Status: DC | PRN
  Administered 2020-10-09 – 2020-10-12 (×6): 2 via ORAL
  Filled 2020-10-09 (×7): qty 2
  Filled 2020-10-09: qty 1

## 2020-10-09 MED ORDER — BISACODYL 10 MG RE SUPP
10.0000 mg | Freq: Every day | RECTAL | Status: DC | PRN
Start: 2020-10-09 — End: 2020-10-12
  Administered 2020-10-11: 10 mg via RECTAL
  Filled 2020-10-09: qty 1

## 2020-10-09 MED ORDER — 0.9 % SODIUM CHLORIDE (POUR BTL) OPTIME
TOPICAL | Status: DC | PRN
  Administered 2020-10-09: 1000 mL

## 2020-10-09 MED ORDER — GABAPENTIN 300 MG PO CAPS
300.0000 mg | ORAL_CAPSULE | Freq: Every day | ORAL | Status: DC
  Administered 2020-10-09 – 2020-10-11 (×3): 300 mg via ORAL
  Filled 2020-10-09 (×3): qty 1

## 2020-10-09 MED ORDER — FENTANYL CITRATE (PF) 100 MCG/2ML IJ SOLN: 25.0000 ug | INTRAMUSCULAR | Status: DC | PRN

## 2020-10-09 MED ORDER — LOSARTAN POTASSIUM 50 MG PO TABS
50.0000 mg | ORAL_TABLET | ORAL | Status: DC
  Administered 2020-10-10 – 2020-10-12 (×3): 50 mg via ORAL
  Filled 2020-10-09 (×3): qty 1

## 2020-10-09 MED ORDER — ONDANSETRON HCL 4 MG/2ML IJ SOLN: 4.0000 mg | Freq: Once | INTRAMUSCULAR | Status: DC | PRN

## 2020-10-09 MED ORDER — PHENYLEPHRINE HCL (PRESSORS) 10 MG/ML IV SOLN
INTRAVENOUS | Status: AC
  Filled 2020-10-09: qty 1

## 2020-10-09 MED ORDER — BUPIVACAINE-EPINEPHRINE (PF) 0.25% -1:200000 IJ SOLN
INTRAMUSCULAR | Status: DC | PRN
  Administered 2020-10-09: 30 mL

## 2020-10-09 MED ORDER — PHENYLEPHRINE HCL (PRESSORS) 10 MG/ML IV SOLN
INTRAVENOUS | Status: DC | PRN
  Administered 2020-10-09: 150 ug via INTRAVENOUS

## 2020-10-09 MED ORDER — METHOCARBAMOL 500 MG PO TABS
1000.0000 mg | ORAL_TABLET | Freq: Four times a day (QID) | ORAL | Status: DC | PRN
  Administered 2020-10-09 – 2020-10-11 (×3): 1000 mg via ORAL
  Filled 2020-10-09 (×3): qty 2

## 2020-10-09 MED ORDER — HYDROCHLOROTHIAZIDE 25 MG PO TABS
25.0000 mg | ORAL_TABLET | Freq: Every day | ORAL | Status: DC
  Administered 2020-10-09 – 2020-10-12 (×4): 25 mg via ORAL
  Filled 2020-10-09 (×4): qty 1

## 2020-10-09 MED ORDER — PROPOFOL 500 MG/50ML IV EMUL
INTRAVENOUS | Status: DC | PRN
  Administered 2020-10-09: 30 ug/kg/min via INTRAVENOUS
  Administered 2020-10-09: 50 ug/kg/min via INTRAVENOUS

## 2020-10-09 MED ORDER — CHLORHEXIDINE GLUCONATE 0.12 % MT SOLN
15.0000 mL | Freq: Once | OROMUCOSAL | Status: AC
  Administered 2020-10-09: 15 mL via OROMUCOSAL

## 2020-10-09 MED ORDER — BUPIVACAINE LIPOSOME 1.3 % IJ SUSP
INTRAMUSCULAR | Status: AC
  Filled 2020-10-09: qty 20

## 2020-10-09 MED ORDER — ACETAMINOPHEN 325 MG PO TABS
325.0000 mg | ORAL_TABLET | Freq: Four times a day (QID) | ORAL | Status: DC | PRN
  Administered 2020-10-10: 650 mg via ORAL
  Filled 2020-10-09: qty 2

## 2020-10-09 MED ORDER — INSULIN ASPART 100 UNIT/ML IJ SOLN
0.0000 [IU] | Freq: Three times a day (TID) | INTRAMUSCULAR | Status: DC
  Administered 2020-10-09: 3 [IU] via SUBCUTANEOUS
  Administered 2020-10-09 – 2020-10-10 (×2): 5 [IU] via SUBCUTANEOUS
  Administered 2020-10-10: 8 [IU] via SUBCUTANEOUS
  Administered 2020-10-10: 5 [IU] via SUBCUTANEOUS
  Administered 2020-10-11: 8 [IU] via SUBCUTANEOUS
  Administered 2020-10-11 – 2020-10-12 (×3): 5 [IU] via SUBCUTANEOUS
  Filled 2020-10-09 (×8): qty 1

## 2020-10-09 MED ORDER — CEFAZOLIN SODIUM-DEXTROSE 2-4 GM/100ML-% IV SOLN
INTRAVENOUS | Status: AC
  Filled 2020-10-09: qty 100

## 2020-10-09 MED ORDER — FAMOTIDINE 20 MG PO TABS
ORAL_TABLET | ORAL | Status: AC
  Filled 2020-10-09: qty 1

## 2020-10-09 MED ORDER — MENTHOL 3 MG MT LOZG
1.0000 | LOZENGE | OROMUCOSAL | Status: DC | PRN
  Filled 2020-10-09: qty 9

## 2020-10-09 MED ORDER — TRAMADOL HCL 50 MG PO TABS
50.0000 mg | ORAL_TABLET | Freq: Four times a day (QID) | ORAL | Status: DC
  Administered 2020-10-09 – 2020-10-12 (×10): 50 mg via ORAL
  Filled 2020-10-09 (×11): qty 1

## 2020-10-09 MED ORDER — FAMOTIDINE 20 MG PO TABS
20.0000 mg | ORAL_TABLET | Freq: Once | ORAL | Status: AC
  Administered 2020-10-09: 20 mg via ORAL

## 2020-10-09 MED ORDER — SODIUM CHLORIDE 0.9 % IV SOLN
INTRAVENOUS | Status: DC | PRN
  Administered 2020-10-09: 60 mL

## 2020-10-09 MED ORDER — INSULIN REGULAR HUMAN 100 UNIT/ML IJ SOLN: 5.0000 [IU] | Freq: Three times a day (TID) | INTRAMUSCULAR | Status: DC | PRN

## 2020-10-09 MED ORDER — MAGNESIUM CITRATE PO SOLN
1.0000 | Freq: Once | ORAL | Status: AC | PRN
  Administered 2020-10-11: 1 via ORAL
  Filled 2020-10-09 (×2): qty 296

## 2020-10-09 MED ORDER — CEFAZOLIN SODIUM-DEXTROSE 2-4 GM/100ML-% IV SOLN
2.0000 g | Freq: Four times a day (QID) | INTRAVENOUS | Status: AC
  Administered 2020-10-09 (×2): 2 g via INTRAVENOUS
  Filled 2020-10-09 (×2): qty 100

## 2020-10-09 MED ORDER — AMLODIPINE BESYLATE 5 MG PO TABS
5.0000 mg | ORAL_TABLET | Freq: Every day | ORAL | Status: DC
  Administered 2020-10-09 – 2020-10-11 (×3): 5 mg via ORAL
  Filled 2020-10-09 (×3): qty 1

## 2020-10-09 MED ORDER — BUPIVACAINE HCL (PF) 0.5 % IJ SOLN
INTRAMUSCULAR | Status: AC
  Filled 2020-10-09: qty 10

## 2020-10-09 MED ORDER — MORPHINE SULFATE (PF) 2 MG/ML IV SOLN
0.5000 mg | INTRAVENOUS | Status: DC | PRN
  Administered 2020-10-09 (×2): 1 mg via INTRAVENOUS
  Filled 2020-10-09 (×2): qty 1

## 2020-10-09 MED ORDER — DOCUSATE SODIUM 100 MG PO CAPS
100.0000 mg | ORAL_CAPSULE | Freq: Two times a day (BID) | ORAL | Status: DC
  Administered 2020-10-09 – 2020-10-12 (×7): 100 mg via ORAL
  Filled 2020-10-09 (×7): qty 1

## 2020-10-09 MED ORDER — SODIUM CHLORIDE FLUSH 0.9 % IV SOLN
INTRAVENOUS | Status: AC
  Filled 2020-10-09: qty 40

## 2020-10-09 MED ORDER — ASPIRIN EC 81 MG PO TBEC
81.0000 mg | DELAYED_RELEASE_TABLET | Freq: Every day | ORAL | Status: DC
  Administered 2020-10-09 – 2020-10-12 (×4): 81 mg via ORAL
  Filled 2020-10-09 (×4): qty 1

## 2020-10-09 MED ORDER — MIDAZOLAM HCL 5 MG/5ML IJ SOLN
INTRAMUSCULAR | Status: DC | PRN
  Administered 2020-10-09: 2 mg via INTRAVENOUS

## 2020-10-09 MED ORDER — ENOXAPARIN SODIUM 30 MG/0.3ML IJ SOSY
30.0000 mg | PREFILLED_SYRINGE | Freq: Two times a day (BID) | INTRAMUSCULAR | Status: DC
  Administered 2020-10-10 – 2020-10-12 (×5): 30 mg via SUBCUTANEOUS
  Filled 2020-10-09 (×5): qty 0.3

## 2020-10-09 MED ORDER — SODIUM CHLORIDE 0.9 % IV SOLN
INTRAVENOUS | Status: DC | PRN
  Administered 2020-10-09: 10 ug/min via INTRAVENOUS

## 2020-10-09 MED ORDER — HYDROCODONE-ACETAMINOPHEN 7.5-325 MG PO TABS: 1.0000 | ORAL_TABLET | ORAL | Status: DC | PRN

## 2020-10-09 MED ORDER — METHOCARBAMOL 500 MG PO TABS
500.0000 mg | ORAL_TABLET | Freq: Four times a day (QID) | ORAL | Status: DC | PRN
  Administered 2020-10-09: 500 mg via ORAL
  Filled 2020-10-09: qty 1

## 2020-10-09 MED ORDER — ONDANSETRON HCL 4 MG/2ML IJ SOLN
4.0000 mg | Freq: Four times a day (QID) | INTRAMUSCULAR | Status: DC | PRN
  Administered 2020-10-09: 4 mg via INTRAVENOUS
  Filled 2020-10-09: qty 2

## 2020-10-09 MED ORDER — PROPOFOL 10 MG/ML IV BOLUS
INTRAVENOUS | Status: AC
  Filled 2020-10-09: qty 20

## 2020-10-09 MED ORDER — PANTOPRAZOLE SODIUM 40 MG PO TBEC
40.0000 mg | DELAYED_RELEASE_TABLET | Freq: Every day | ORAL | Status: DC
  Administered 2020-10-09 – 2020-10-12 (×4): 40 mg via ORAL
  Filled 2020-10-09 (×4): qty 1

## 2020-10-09 MED ORDER — METOCLOPRAMIDE HCL 5 MG/ML IJ SOLN
5.0000 mg | Freq: Three times a day (TID) | INTRAMUSCULAR | Status: DC | PRN
  Filled 2020-10-09: qty 2

## 2020-10-09 MED ORDER — MORPHINE SULFATE (PF) 10 MG/ML IV SOLN
INTRAVENOUS | Status: AC
  Filled 2020-10-09: qty 1

## 2020-10-09 MED ORDER — PROPOFOL 1000 MG/100ML IV EMUL
INTRAVENOUS | Status: AC
  Filled 2020-10-09: qty 100

## 2020-10-09 MED ORDER — ONDANSETRON HCL 4 MG/2ML IJ SOLN
INTRAMUSCULAR | Status: AC
  Filled 2020-10-09: qty 2

## 2020-10-09 MED ORDER — METFORMIN HCL 500 MG PO TABS
1000.0000 mg | ORAL_TABLET | Freq: Two times a day (BID) | ORAL | Status: DC
  Administered 2020-10-09 – 2020-10-12 (×6): 1000 mg via ORAL
  Filled 2020-10-09 (×6): qty 2

## 2020-10-09 MED ORDER — MAGNESIUM OXIDE -MG SUPPLEMENT 400 (240 MG) MG PO TABS
400.0000 mg | ORAL_TABLET | Freq: Every evening | ORAL | Status: DC
  Administered 2020-10-09 – 2020-10-11 (×3): 400 mg via ORAL
  Filled 2020-10-09 (×3): qty 1

## 2020-10-09 MED ORDER — POLYETHYLENE GLYCOL 3350 17 G PO PACK
17.0000 g | PACK | Freq: Every day | ORAL | Status: DC | PRN
  Administered 2020-10-10 – 2020-10-11 (×2): 17 g via ORAL
  Filled 2020-10-09 (×2): qty 1

## 2020-10-09 MED ORDER — ALUM & MAG HYDROXIDE-SIMETH 200-200-20 MG/5ML PO SUSP: 30.0000 mL | ORAL | Status: DC | PRN

## 2020-10-09 MED ORDER — INSULIN NPH (HUMAN) (ISOPHANE) 100 UNIT/ML ~~LOC~~ SUSP: 15.0000 [IU] | Freq: Two times a day (BID) | SUBCUTANEOUS | Status: DC

## 2020-10-09 MED ORDER — KETOROLAC TROMETHAMINE 15 MG/ML IJ SOLN
15.0000 mg | Freq: Once | INTRAMUSCULAR | Status: AC
  Administered 2020-10-09: 15 mg via INTRAVENOUS
  Filled 2020-10-09: qty 1

## 2020-10-09 SURGICAL SUPPLY — 68 items
BLADE SAGITTAL 25.0X1.19X90 (BLADE) ×2 IMPLANT
BLADE SAW 90X13X1.19 OSCILLAT (BLADE) ×2 IMPLANT
BNDG ELASTIC 6X5.8 VLCR STR LF (GAUZE/BANDAGES/DRESSINGS) ×2 IMPLANT
CANISTER SUCT 1200ML W/VALVE (MISCELLANEOUS) ×2 IMPLANT
CANISTER WOUND CARE 500ML ATS (WOUND CARE) ×2 IMPLANT
CEMENT HV SMART SET (Cement) ×4 IMPLANT
CHLORAPREP W/TINT 26 (MISCELLANEOUS) ×4 IMPLANT
COOLER POLAR GLACIER W/PUMP (MISCELLANEOUS) ×2 IMPLANT
CUFF TOURN SGL QUICK 24 (TOURNIQUET CUFF)
CUFF TOURN SGL QUICK 34 (TOURNIQUET CUFF)
CUFF TRNQT CYL 24X4X16.5-23 (TOURNIQUET CUFF) IMPLANT
CUFF TRNQT CYL 34X4.125X (TOURNIQUET CUFF) IMPLANT
DRAPE 3/4 80X56 (DRAPES) ×4 IMPLANT
DRSG MEPILEX SACRM 8.7X9.8 (GAUZE/BANDAGES/DRESSINGS) ×2 IMPLANT
ELECT CAUTERY BLADE 6.4 (BLADE) ×2 IMPLANT
ELECT REM PT RETURN 9FT ADLT (ELECTROSURGICAL) ×2
ELECTRODE REM PT RTRN 9FT ADLT (ELECTROSURGICAL) ×1 IMPLANT
FEMORAL COMP CEMENTED SZ6 RT (Femur) ×2 IMPLANT
GAUZE 4X4 16PLY ~~LOC~~+RFID DBL (SPONGE) ×2 IMPLANT
GAUZE SPONGE 4X4 12PLY STRL (GAUZE/BANDAGES/DRESSINGS) ×2 IMPLANT
GAUZE XEROFORM 1X8 LF (GAUZE/BANDAGES/DRESSINGS) ×2 IMPLANT
GLOVE SURG ORTHO LTX SZ8 (GLOVE) ×2 IMPLANT
GLOVE SURG SYN 9.0  PF PI (GLOVE) ×2
GLOVE SURG SYN 9.0 PF PI (GLOVE) ×1 IMPLANT
GLOVE SURG UNDER LTX SZ8 (GLOVE) ×2 IMPLANT
GLOVE SURG UNDER POLY LF SZ9 (GLOVE) ×2 IMPLANT
GOWN SRG 2XL LVL 4 RGLN SLV (GOWNS) ×1 IMPLANT
GOWN STRL NON-REIN 2XL LVL4 (GOWNS) ×2
GOWN STRL REUS W/ TWL LRG LVL3 (GOWN DISPOSABLE) ×1 IMPLANT
GOWN STRL REUS W/ TWL XL LVL3 (GOWN DISPOSABLE) ×1 IMPLANT
GOWN STRL REUS W/TWL LRG LVL3 (GOWN DISPOSABLE) ×2
GOWN STRL REUS W/TWL XL LVL3 (GOWN DISPOSABLE) ×2
HOLDER FOLEY CATH W/STRAP (MISCELLANEOUS) ×2 IMPLANT
HOOD PEEL AWAY FLYTE STAYCOOL (MISCELLANEOUS) ×4 IMPLANT
IRRIGATION SURGIPHOR STRL (IV SOLUTION) IMPLANT
IV NS IRRIG 3000ML ARTHROMATIC (IV SOLUTION) ×2 IMPLANT
KIT PREVENA INCISION MGT20CM45 (CANNISTER) ×2 IMPLANT
KIT TURNOVER KIT A (KITS) ×2 IMPLANT
MANIFOLD NEPTUNE II (INSTRUMENTS) ×2 IMPLANT
NDL SAFETY ECLIPSE 18X1.5 (NEEDLE) ×1 IMPLANT
NEEDLE HYPO 18GX1.5 SHARP (NEEDLE) ×2
NEEDLE SPNL 18GX3.5 QUINCKE PK (NEEDLE) ×2 IMPLANT
NEEDLE SPNL 20GX3.5 QUINCKE YW (NEEDLE) ×2 IMPLANT
NS IRRIG 1000ML POUR BTL (IV SOLUTION) ×2 IMPLANT
PACK TOTAL KNEE (MISCELLANEOUS) ×2 IMPLANT
PAD WRAPON POLAR KNEE (MISCELLANEOUS) ×1 IMPLANT
PATELLA RESURFACING MEDACTA SZ (Bone Implant) ×2 IMPLANT
PENCIL SMOKE EVACUATOR COATED (MISCELLANEOUS) ×2 IMPLANT
PULSAVAC PLUS IRRIG FAN TIP (DISPOSABLE) ×2
SCALPEL PROTECTED #10 DISP (BLADE) ×4 IMPLANT
SPONGE T-LAP 18X18 ~~LOC~~+RFID (SPONGE) ×6 IMPLANT
STAPLER SKIN PROX 35W (STAPLE) ×2 IMPLANT
STEM EXTENSION 11MMX30MM (Stem) ×2 IMPLANT
SUCTION FRAZIER HANDLE 10FR (MISCELLANEOUS) ×2
SUCTION TUBE FRAZIER 10FR DISP (MISCELLANEOUS) ×1 IMPLANT
SUT DVC 2 QUILL PDO  T11 36X36 (SUTURE) ×2
SUT DVC 2 QUILL PDO T11 36X36 (SUTURE) ×1 IMPLANT
SUT ETHIBOND 2 V 37 (SUTURE) IMPLANT
SUT V-LOC 90 ABS DVC 3-0 CL (SUTURE) ×2 IMPLANT
SYR 20ML LL LF (SYRINGE) ×2 IMPLANT
SYR 50ML LL SCALE MARK (SYRINGE) ×4 IMPLANT
TIBIAL INSERT FIXED SZ5 RIGHT (Insert) ×2 IMPLANT
TIBIAL TRAY FIXED (Bone Implant) ×2 IMPLANT
TIP FAN IRRIG PULSAVAC PLUS (DISPOSABLE) ×1 IMPLANT
TOWEL OR 17X26 4PK STRL BLUE (TOWEL DISPOSABLE) ×2 IMPLANT
TOWER CARTRIDGE SMART MIX (DISPOSABLE) ×2 IMPLANT
TRAY FOLEY MTR SLVR 16FR STAT (SET/KITS/TRAYS/PACK) ×2 IMPLANT
WRAPON POLAR PAD KNEE (MISCELLANEOUS) ×2

## 2020-10-09 NOTE — Anesthesia Preprocedure Evaluation (Addendum)
Anesthesia Evaluation  Patient identified by MRN, date of birth, ID band Patient awake    Reviewed: Allergy & Precautions, NPO status , Patient's Chart, lab work & pertinent test results  History of Anesthesia Complications Negative for: history of anesthetic complications  Airway Mallampati: II  TM Distance: >3 FB Neck ROM: Full    Dental  (+) Poor Dentition   Pulmonary neg sleep apnea, neg COPD, former smoker,    breath sounds clear to auscultation- rhonchi (-) wheezing      Cardiovascular Exercise Tolerance: Good hypertension, Pt. on medications (-) CAD, (-) Past MI, (-) Cardiac Stents and (-) CABG Dysrhythmias: hx of afib s/p ablation.  Rhythm:Regular Rate:Normal - Systolic murmurs and - Diastolic murmurs    Neuro/Psych neg Seizures negative neurological ROS  negative psych ROS   GI/Hepatic negative GI ROS, Neg liver ROS,   Endo/Other  diabetes, Insulin Dependent  Renal/GU negative Renal ROS     Musculoskeletal  (+) Arthritis ,   Abdominal (+) - obese,   Peds  Hematology negative hematology ROS (+)   Anesthesia Other Findings Past Medical History: No date: Arthritis     Comment:  osteoarthritis No date: Charcot's arthropathy 1994: Diabetes mellitus     Comment:  Type II No date: Diabetic retinopathy ([place]) No date: Dysrhythmia     Comment:  AF, history of an ablation in 2005 No date: Hammer toe of left foot 05/1997: Hyperlipidemia 1995: Hypertension No date: Neuromuscular disorder ([place])     Comment:  polyneuropathies d/t diabetes 01/2020: Osteomyelitis ([place])     Comment:  left great toe 01/2020: Skin ulcer ([place])     Comment:  full thickness ulceration over IPJ. 01/2020: Tinnitus     Comment:  bilateral with sensory hearing loss   Reproductive/Obstetrics                             Lab Results  Component Value Date   WBC 8.8 09/25/2020   HGB 13.4 09/25/2020   HCT 38.5  (L) 09/25/2020   MCV 86.5 09/25/2020   PLT 204 09/25/2020    Anesthesia Physical Anesthesia Plan  ASA: 3  Anesthesia Plan: Spinal   Post-op Pain Management:    Induction:   PONV Risk Score and Plan: 1 and Propofol infusion  Airway Management Planned: Natural Airway  Additional Equipment:   Intra-op Plan:   Post-operative Plan:   Informed Consent: I have reviewed the patients History and Physical, chart, labs and discussed the procedure including the risks, benefits and alternatives for the proposed anesthesia with the patient or authorized representative who has indicated his/her understanding and acceptance.     Dental advisory given  Plan Discussed with: CRNA and Anesthesiologist  Anesthesia Plan Comments:         Anesthesia Quick Evaluation

## 2020-10-09 NOTE — Anesthesia Procedure Notes (Addendum)
Spinal  Patient location during procedure: OR Start time: 10/09/2020 7:32 AM End time: 10/09/2020 7:41 AM Reason for block: surgical anesthesia Staffing Performed: resident/CRNA  Anesthesiologist: Emmie Niemann, MD Resident/CRNA: Rona Ravens, CRNA Preanesthetic Checklist Completed: patient identified, IV checked, site marked, risks and benefits discussed, surgical consent, monitors and equipment checked, pre-op evaluation and timeout performed Spinal Block Patient position: sitting Prep: ChloraPrep Patient monitoring: heart rate, continuous pulse ox, blood pressure and cardiac monitor Approach: midline Location: L4-5 Injection technique: single-shot Needle Needle type: [place]acre and Introducer  Needle gauge: 24 G Needle length: 9 cm Assessment Events: CSF return Additional Notes Negative paresthesia. Negative blood return. Positive free-flowing CSF. Expiration date of kit checked and confirmed. Patient tolerated procedure well, without complications.

## 2020-10-09 NOTE — Transfer of Care (Signed)
Immediate Anesthesia Transfer of Care Note  Patient: Dillon Henry  Procedure(s) Performed: TOTAL KNEE ARTHROPLASTY - Rachelle Hora to Assist (Right: Knee)  Patient Location: PACU  Anesthesia Type:MAC and Spinal  Level of Consciousness: awake, alert  and oriented  Airway & Oxygen Therapy: Patient Spontanous Breathing  Post-op Assessment: Report given to RN and Post -op Vital signs reviewed and stable  Post vital signs: Reviewed and stable  Last Vitals:  Vitals Value Taken Time  BP 111/66 10/09/20 0949  Temp    Pulse 72 10/09/20 0954  Resp 14 10/09/20 0954  SpO2 96 % 10/09/20 0954  Vitals shown include unvalidated device data.  Last Pain:  Vitals:   10/09/20 0705  TempSrc:   PainSc: 0-No pain         Complications: No notable events documented.

## 2020-10-09 NOTE — Evaluation (Signed)
Occupational Therapy Evaluation Patient Details Name: Dillon Henry MRN: [id number] DOB: [birth year] Today's Date: 10/09/2020    History of Present Illness Dillon Henry is a 70 y.o. male s/p R TKA. PMH includes: A fib, charcot's arthropathy, diabetic retinopathy, HT, HLD, DM Type 2 with peripheral neuropathy.   Clinical Impression   Pt seen for OT evaluation this date, POD#0 from above surgery. Pt was independent in all ADL prior to surgery, however was having increasing difficulty with prolonged standing and activity due to R knee pain. Pt is eager to return to PLOF with less pain and improved safety and independence. Pt currently requires PRN minimal assist for LB bathing and dressing while in seated position due to pain and limited AROM of R knee, CGA-MIN A for ADL transfers + RW, and MOD A for polar care and compression stocking mgt.  Pt/spouse instructed in polar care mgt, falls prevention strategies, home/routines modifications, DME/AE for LB bathing and dressing tasks, and compression stocking mgt. Both verbalized understanding. Pt nauseated at end of session after ADL mobility. RN notified. Emesis basins provided. RN in to assess. Pt will benefit from additional skilled OT services while hospitalized including additional instruction in dressing techniques with or without assistive devices for dressing and bathing skills to support recall and carryover prior to discharge and ultimately to maximize safety, independence, and minimize falls risk and caregiver burden. Do not currently anticipate any OT needs following this hospitalization. Will bring TKA handout next session.    Follow Up Recommendations  No OT follow up    Equipment Recommendations  None recommended by OT    Recommendations for Other Services       Precautions / Restrictions Precautions Precautions: Knee Precaution Booklet Issued: No Restrictions Weight Bearing Restrictions: Yes RLE Weight Bearing: Weight  bearing as tolerated      Mobility Bed Mobility Overal bed mobility: Needs Assistance Bed Mobility: Sit to Supine     Supine to sit: Supervision;Mod assist;HOB elevated Sit to supine: Min guard   General bed mobility comments: CGA for RLE back to bed    Transfers Overall transfer level: Needs assistance Equipment used: Rolling walker (2 wheeled) Transfers: Sit to/from Stand Sit to Stand: Min assist;Min guard Stand pivot transfers: Min assist       General transfer comment: cues for hand placement    Balance Overall balance assessment: Needs assistance Sitting-balance support: No upper extremity supported;Feet supported Sitting balance-Leahy Scale: Good     Standing balance support: Bilateral upper extremity supported Standing balance-Leahy Scale: Poor Standing balance comment: heavy reliance on BUE support on RW                           ADL either performed or assessed with clinical judgement   ADL Overall ADL's : Needs assistance/impaired                                       General ADL Comments: Pt requires PRN MIN A for LB ADL tasks, CGA-MIN A For ADL transfers + RW, and MOD A for polar care and compression stocking mgt -spouse able to provide needed level of assist     Vision Patient Visual Report: No change from baseline       Perception     Praxis      Pertinent Vitals/Pain Pain Assessment: 0-10 Pain Score: 10-Worst pain ever Pain  Location: "50/10" muscle cramps/spasms Pain Descriptors / Indicators: Cramping;Sharp;Spasm Pain Intervention(s): Limited activity within patient's tolerance;Monitored during session;Premedicated before session;Repositioned;Ice applied;Patient requesting pain meds-RN notified     Hand Dominance     Extremity/Trunk Assessment Upper Extremity Assessment Upper Extremity Assessment: Overall WFL for tasks assessed   Lower Extremity Assessment Lower Extremity Assessment: RLE  deficits/detail RLE Deficits / Details: s/p TKA strength and ROM deficits noted RLE Sensation: decreased light touch;history of peripheral neuropathy LLE Sensation: history of peripheral neuropathy;decreased light touch;decreased proprioception   Cervical / Trunk Assessment Cervical / Trunk Assessment: Normal   Communication Communication Communication: No difficulties   Cognition Arousal/Alertness: Awake/alert Behavior During Therapy: WFL for tasks assessed/performed Overall Cognitive Status: Within Functional Limits for tasks assessed                                    General Comments       Exercises Other Exercises: Pt/spouse educated in AE/DME, falls prevention, polar care mgt, compression stocking mgt, car transfers, RW mgt for ADL transfers, and home/routines modifications to maximize safety/indep with ADL/mobility   Shoulder Instructions      Home Living Family/patient expects to be discharged to:: Private residence Living Arrangements: Spouse/significant other;Parent Available Help at Discharge: Family Type of Home: House Home Access: Ramped entrance;Level entry     Home Layout: One level;Able to live on main level with bedroom/bathroom     Bathroom Shower/Tub: Other (comment);Walk-in shower (handicap shower)   Bathroom Toilet: Handicapped height Bathroom Accessibility: Yes   Home Equipment: Grab bars - toilet;Walker - 2 wheels;Bedside commode;Grab bars - tub/shower;Shower seat;Cane - single point;Toilet riser;Wheelchair - power;Wheelchair - manual;Shower seat - built in          Prior Functioning/Environment Level of Independence: Independent                 OT Problem List: Decreased strength;Pain;Decreased range of motion;Impaired balance (sitting and/or standing);Decreased knowledge of use of DME or AE      OT Treatment/Interventions: Self-care/ADL training;Therapeutic exercise;Therapeutic activities;DME and/or AE  instruction;Patient/family education;Balance training    OT Goals(Current goals can be found in the care plan section) Acute Rehab OT Goals Patient Stated Goal: go home OT Goal Formulation: With patient/family Time For Goal Achievement: 10/23/20 Potential to Achieve Goals: Good ADL Goals Pt Will Perform Lower Body Dressing: with supervision;with set-up;sit to/from stand Pt Will Transfer to Toilet: with supervision;ambulating (elevated commode, LRAD for amb) Additional ADL Goal #1: Pt will independently instruct family in polar care mgt Additional ADL Goal #2: Pt will independently instruct family in compression stocking mgt  OT Frequency: Min 1X/week   Barriers to D/C:            Co-evaluation              AM-PAC OT "6 Clicks" Daily Activity     Outcome Measure Help from another person eating meals?: None Help from another person taking care of personal grooming?: None Help from another person toileting, which includes using toliet, bedpan, or urinal?: A Little Help from another person bathing (including washing, rinsing, drying)?: A Little Help from another person to put on and taking off regular upper body clothing?: None Help from another person to put on and taking off regular lower body clothing?: A Little 6 Click Score: 21   End of Session Equipment Utilized During Treatment: Rolling walker Nurse Communication: Patient requests pain meds;Mobility status;Other (comment) (nausea)  Activity Tolerance: Patient tolerated treatment well;Other (comment) (nausea at end of session) Patient left: in bed;with call bell/phone within reach;with bed alarm set;with nursing/sitter in room;with family/visitor present;with SCD's reapplied;Other (comment) (rolled towel under ankle, polar care in place)  OT Visit Diagnosis: Other abnormalities of gait and mobility (R26.89);Pain Pain - Right/Left: Right Pain - part of body: Knee;Leg                Time: 9021-1155 OT Time Calculation  (min): 51 min Charges:  OT General Charges $OT Visit: 1 Visit OT Evaluation $OT Eval Moderate Complexity: 1 Mod OT Treatments $Self Care/Home Management : 38-52 mins  Hanley Hays, MPH, MS, OTR/L ascom [phone] 10/09/20, 5:08 PM

## 2020-10-09 NOTE — H&P (Signed)
Chief Complaint  Patient presents with   Pre-op Exam  Right TKA scheduled 10/09/20   Dillon Henry is a 70 y.o. male who presents today for history and physical for right total knee arthroplasty with Dr. Hessie Knows on 10/09/2020. Patient has a history of severe right knee osteoarthritis has been present and painful for at least 5 years. Pain has progressed to the point where it is causing daily discomfort and changes in activities of daily living. Patient frequently will find himself limping. He has had injections in the past with short-term relief. Patient's pain can be moderate to severe. He has some pain with rest as well as with activity. Most the pain is located along the lateral aspect of the knee and occasionally the knee will feel as if it will give way. At times he will feel swelling.  Patient has a history of type 2 diabetes, A1c less than 7. He has no history of blood clots.  Past Medical History: Past Medical History:  Diagnosis Date   Atrial fibrillation (CMS-HCC)   Charcot's arthropathy   Dermatophytosis of nail   Diabetic retinopathy (CMS-HCC)   Hyperlipidemia   Hypertension   Osteoarthritis   Tubular adenoma of colon, unspecified 12/23/2016   Type 2 diabetes mellitus with peripheral neuropathy (CMS-HCC)   Past Surgical History: Past Surgical History:  Procedure Laterality Date   ATRIAL ABLATION 2005   CARDIAC SURGERY 2005  Ablation   COLONOSCOPY 03/10/11  redundant colon, diverticulosis in rectum, sigmoid, descending colon, transverse, ascending colon, and cecum   COLONOSCOPY 12/23/2016  Tubular adenoma of colon/Repeat 47yr/MUS   HAMMER TOE REPAIR Right   laser eye surgery 2019   Partial amputation from middle toe on right foot  Done by Dr. TElvina Mattes  TONSILLECTOMY   VASECTOMY   Past Family History: Family History  Problem Relation Age of Onset   Leukemia Father   Colon cancer Sister   Dementia Mother   High blood pressure (Hypertension) Mother   Heart  disease Mother   Diabetes Maternal Aunt   Diabetes Maternal Uncle   No Known Problems Brother   Diabetes Maternal Grandmother   Myocardial Infarction (Heart attack) Maternal Grandfather   No Known Problems Paternal Grandmother   No Known Problems Paternal Grandfather   No Known Problems Sister   No Known Problems Sister   No Known Problems Daughter   No Known Problems Daughter   No Known Problems Daughter   No Known Problems Son   Medications: Current Outpatient Medications Ordered in Epic  Medication Sig Dispense Refill   acetaminophen (TYLENOL) 500 MG tablet Take 2 tablets (1,000 mg total) by mouth every 8 (eight) hours 30 tablet 0   amLODIPine (NORVASC) 5 MG tablet TAKE 1 TABLET BY MOUTH EVERY DAY 90 tablet 1   aspirin 81 MG EC tablet Take 81 mg by mouth once daily.   blood glucose diagnostic (ONETOUCH ULTRA BLUE TEST STRIP) test strip CHECK BLOOD SUGAR FASTING ONCE DAILY. DX E11.49 100 strip 1   blood glucose meter kit Use as directed Check blood sugar fasting once daily. Dx E11.49 ONE TOUCH ULTRA 1 each 0   gabapentin (NEURONTIN) 300 MG capsule Take 300 mg by mouth once daily   glipiZIDE (GLUCOTROL) 10 MG tablet Take 1 tablet (10 mg total) by mouth daily with breakfast 90 tablet 3   hydroCHLOROthiazide (HYDRODIURIL) 25 MG tablet TAKE 1 TABLET BY MOUTH EVERY DAY 90 tablet 1   insulin DETEMIR (LEVEMIR) injection (concentration 100 units/mL) Inject 20 units  twice daily, as directed (Patient not taking: No sig reported) 30 mL 3   insulin NPH human isophane (HUMULIN N NPH U-100 INSULIN SUBQ) Inject 15-20 Units subcutaneously 2 (two) times daily   insulin REGULAR (HUMULIN R) injection (concentration 100 units/mL) Inject 8-12 units up to twice daily before meals (Patient taking differently: Inject 10-20 units up to twice daily before meals) 10 mL 12   lancets Check blood sugar fasting once daily. Dx E11.49 ONE TOUCH ULTRA 100 each 1   losartan (COZAAR) 50 MG tablet TAKE 1 TABLET BY MOUTH  EVERY DAY 90 tablet 1   magnesium oxide (MAG-OX) 400 mg tablet Take 400 mg by mouth once daily.   metFORMIN (GLUCOPHAGE) 1000 MG tablet Take 1 tablet (1,000 mg total) by mouth 2 (two) times daily 180 tablet 3   pen needle, diabetic (BD ULTRA-FINE MINI PEN NEEDLE) 31 gauge x 3/16" needle Use once daily 100 each 3   pravastatin (PRAVACHOL) 20 MG tablet TAKE 1 TABLET BY MOUTH EVERY DAY AT NIGHT 90 tablet 1   PSYLLIUM SEED, WITH DEXTROSE, (FIBER ORAL) Take 1 tablet by mouth once daily   zinc 50 mg Tab Take 1 tablet by mouth as directed Takes 1 tablet PO QOD PRN   No current Epic-ordered facility-administered medications on file.   Allergies: Allergies  Allergen Reactions   Lisinopril Cough   Pioglitazone Unknown and Other (See Comments)  REACTION: TOOTH PAIN REACTION: TOOTH PAIN REACTION: TOOTH PAIN   Rosiglitazone Maleate Unknown and Other (See Comments)  REACTION: WEIGHT GAIN REACTION: WEIGHT GAIN REACTION: WEIGHT GAIN    Review of Systems:  A comprehensive 14 point ROS was performed, reviewed by me today, and the pertinent orthopaedic findings are documented in the HPI.  Exam: BP (!) 140/62  Wt (!) 103 kg (227 lb)  BMI 29.95 kg/m  General:  Well developed, well nourished, no apparent distress, normal affect, normal gait with no antalgic component.   HEENT: Head normocephalic, atraumatic, PERRL.   Abdomen: Soft, non tender, non distended, Bowel sounds present.  Heart: Examination of the heart reveals regular rate with irregular rhythm. There is no murmur noted on ascultation.   Lungs: Lungs are clear to auscultation. There is no wheeze, rhonchi, or crackles. There is normal expansion of bilateral chest walls.   Right lower extremity:  Examination of the right knee shows patient has mild effusion with no warmth erythema. He has a small Baker's cyst present. He is tender along the lateral joint line and nontender along the medial joint line. He has no laxity with valgus or  varus stress testing. He is able to straight leg raise. Range of motion 10 to 110 degrees. He has full range of motion of the hip with no discomfort. His patella tracks well, he is able to straight leg raise with no quad atrophy or patellar tendon defect. He has no swelling or edema throughout the right lower leg.  AP lateral sunrise views of the right knee are reviewed by me in the office today. Impression: Patient has 90% loss of joint space in the lateral compartment with severe spurring and subchondral sclerosing. Moderate joint space narrowing noted in the medial compartment. Patient has severe patellofemoral arthritis with complete loss of joint space along the patellofemoral compartment, severe spurring along the lateral patella facet with lateral tracking of the patella in the trochlear groove.  Impression: Primary osteoarthritis of right knee [M17.11] Primary osteoarthritis of right knee (primary encounter diagnosis) Effusion of right knee  Plan:  31. 70 year old male with greater than 5 years of increasing right knee pain. Patient's pain is beginning to interfere with quality of life and activities of daily living. Risks, benefits, complications of a right total knee arthroplasty have been discussed with the patient. Patient has agreed and consented the procedure with Dr. Hessie Knows on 10/09/2020. 2. Patient noted to have irregular heart rhythm on today's exam. Will refer to cardiology for evaluation prior to total knee arthroplasty.  This note was generated in part with voice recognition software and I apologize for any typographical errors that were not detected and corrected.  Feliberto Gottron MPA-C   Electronically signed by Feliberto Gottron, [place] at 09/25/2020 4:06 PM EDT  Reviewed  H+P. No changes noted.

## 2020-10-09 NOTE — Evaluation (Signed)
Physical Therapy Evaluation Patient Details Name: Dillon Henry MRN: [id number] DOB: January 17, 1951 Today's Date: 10/09/2020   History of Present Illness  Dillon Henry is a 70 y.o. male s/p R TKA. PMH includes: A fib, charcot's arthropathy, diabetic retinopathy, HT, HLD, DM Type 2 with peripheral neuropathy.   Clinical Impression  Pt admitted with above diagnosis. Pt received seated in bed with HOB elevated and spouse in room. Agreeable to PT services. Pt denies pain but reports feeling "loopy". Appropriate conversation throughout and denies lightheadedness/dizziness. Decreased sensation to LT noted in BLE's due to hx of neuropathy. Normal sensation at proximal tib/fib bilat. Pt able to mobilize with supervision to EOB with modA for RLE management due to nerve block. Able to DF/PF B feet however. ModA STS to RW with significant L lat lean due to lack of quad control on RLE. PT minguard provided throughout. Pt able to laterally sway R <> L and distribute weight over RLE without buckling of knee along with alternating marches. Stand pivot to recliner with multimodal cuing and increased time to perform due to difficulty clearing feet. Noted decreased proprioception of LLE when mobilizing LLE during transfer. Nursing staff notified pt to perform scoot transfer from recliner back to bed to optimize safety due to current level of assist required with stand pivot from PT. Pt has adequate support at home from spouse and fully independent prior to elective TKA. Anticipate after nerve block dissipates pt will be safe to d/c home with HHPT. Pt currently with functional limitations due to the deficits listed below (see PT Problem List). Pt will benefit from skilled PT to increase their independence and safety with mobility to allow discharge to the venue listed below.       Follow Up Recommendations Home health PT;Follow surgeon's recommendation for DC plan and follow-up therapies;Supervision -  Intermittent    Equipment Recommendations  None recommended by PT    Recommendations for Other Services       Precautions / Restrictions Precautions Precautions: Knee Precaution Booklet Issued: No Restrictions Weight Bearing Restrictions: Yes RLE Weight Bearing: Weight bearing as tolerated      Mobility  Bed Mobility Overal bed mobility: Needs Assistance Bed Mobility: Supine to Sit     Supine to sit: Supervision;Mod assist;HOB elevated     General bed mobility comments: ModA for RLE management    Transfers Overall transfer level: Needs assistance Equipment used: Rolling walker (2 wheeled) Transfers: Sit to/from [facility] Sit to Stand: Mod assist Stand pivot transfers: Min assist       General transfer comment: Relied on min to [place] for functional mobility for safety due to nerve block on RLE and periph neuropahty impairing sensation/proprioception on LLE.  Ambulation/Gait             General Gait Details: Seated in recliner. Deferred due to nerve block limiting RLE function.  Stairs            Wheelchair Mobility    Modified Rankin (Stroke Patients Only)       Balance Overall balance assessment: Needs assistance Sitting-balance support: No upper extremity supported;Feet supported Sitting balance-Leahy Scale: Good       Standing balance-Leahy Scale: Poor                               Pertinent Vitals/Pain Pain Assessment: No/denies pain    Home Living Family/patient expects to be discharged to:: Private residence Living Arrangements: Spouse/significant  other;Parent Available Help at Discharge: Family Type of Home: House Home Access: Ramped entrance;Level entry     Home Layout: One level;Able to live on main level with bedroom/bathroom Home Equipment: Grab bars - toilet;Walker - 2 wheels;Bedside commode;Grab bars - tub/shower;Shower seat;Cane - single point;Toilet riser;Wheelchair - power;Wheelchair -  manual      Prior Function Level of Independence: Independent               Hand Dominance        Extremity/Trunk Assessment   Upper Extremity Assessment Upper Extremity Assessment: Defer to OT evaluation    Lower Extremity Assessment Lower Extremity Assessment: Generalized weakness;RLE deficits/detail;LLE deficits/detail RLE Deficits / Details: Surgical knee RLE Sensation: decreased light touch;history of peripheral neuropathy LLE Sensation: history of peripheral neuropathy;decreased light touch;decreased proprioception    Cervical / Trunk Assessment Cervical / Trunk Assessment: Normal  Communication   Communication: No difficulties  Cognition Arousal/Alertness: Awake/alert Behavior During Therapy: WFL for tasks assessed/performed Overall Cognitive Status: Within Functional Limits for tasks assessed                                 General Comments: Reports feeling loopy.      General Comments      Exercises Total Joint Exercises Ankle Circles/Pumps: AROM;Both;10 reps;Supine Marching in Standing: AROM;5 reps;Both Other Exercises Other Exercises: Educated on transfer technique and proper TKA positioning.   Assessment/Plan    PT Assessment Patient needs continued PT services  PT Problem List Decreased strength;Decreased mobility;Decreased safety awareness;Decreased range of motion;Decreased activity tolerance;Decreased balance;Impaired sensation;Decreased knowledge of use of DME       PT Treatment Interventions DME instruction;Therapeutic exercise;Gait training;Balance training;Neuromuscular re-education;Functional mobility training;Therapeutic activities;Patient/family education    PT Goals (Current goals can be found in the Care Plan section)  Acute Rehab PT Goals Patient Stated Goal: go home PT Goal Formulation: With patient/family Time For Goal Achievement: 10/23/20 Potential to Achieve Goals: Good    Frequency BID   Barriers to  discharge        Co-evaluation               AM-PAC PT "6 Clicks" Mobility  Outcome Measure Help needed turning from your back to your side while in a flat bed without using bedrails?: A Little Help needed moving from lying on your back to sitting on the side of a flat bed without using bedrails?: A Little Help needed moving to and from a bed to a chair (including a wheelchair)?: A Lot Help needed standing up from a chair using your arms (e.g., wheelchair or bedside chair)?: A Little Help needed to walk in hospital room?: A Lot Help needed climbing 3-5 steps with a railing? : A Lot 6 Click Score: 15    End of Session Equipment Utilized During Treatment: Gait belt Activity Tolerance: Patient tolerated treatment well Patient left: in chair;with chair alarm set;with family/visitor present;with SCD's reapplied Nurse Communication: Mobility status PT Visit Diagnosis: Unsteadiness on feet (R26.81);Other abnormalities of gait and mobility (R26.89);Muscle weakness (generalized) (M62.81);Difficulty in walking, not elsewhere classified (R26.2)    Time: 1250-1318 PT Time Calculation (min) (ACUTE ONLY): 28 min   Charges:   PT Evaluation $PT Eval Low Complexity: 1 Low PT Treatments $Therapeutic Activity: 23-37 mins        Kampbell Holaway M. Fairly IV, PT, DPT Physical Therapist- [facility]  10/09/2020, 2:25 PM

## 2020-10-09 NOTE — Progress Notes (Signed)
Pt admitted to room 148, oriented to room and call bell in reach. VSS.   10/09/20 1139  Vitals  Temp 97.7 F (36.5 C)  BP 140/76  MAP (mmHg) 95  BP Location Right Arm  BP Method Automatic  Patient Position (if appropriate) Lying  Pulse Rate 63  Pulse Rate Source Monitor  Resp (!) 21  MEWS COLOR  MEWS Score Color Green  Oxygen Therapy  SpO2 99 %

## 2020-10-09 NOTE — Op Note (Signed)
10/09/2020  9:50 AM  PATIENT:  Dillon Henry   MRN: [id number]  PRE-OPERATIVE DIAGNOSIS:  Primary localized osteoarthritis of right knee   POST-OPERATIVE DIAGNOSIS:  Same   PROCEDURE:  Procedure(s): Right TOTAL KNEE ARTHROPLASTY   SURGEON: Laurene Footman, MD   ASSISTANTS: Rachelle Hora, PA-C   ANESTHESIA:   spinal   EBL:  100   BLOOD ADMINISTERED:none   DRAINS:  Incisional wound VAC     LOCAL MEDICATIONS USED:  MARCAINE   morphine and Exparel   SPECIMEN:  No Specimen   DISPOSITION OF SPECIMEN:  N/A   COUNTS:  YES   TOURNIQUET: 75 minutes at 300 mm Hg   IMPLANTS: Medacta  GMK sphere system with 6 right femur, 5 right tibia with short stem and 10 mm insert.  Size 3 patella, all components cemented.   DICTATION: Viviann Spare Dictation   patient was brought to the operating room and spinal anesthesia was obtained.  After prepping and draping the right leg in sterile fashion, and after patient identification and timeout procedures were completed, tourniquet was raised  and midline skin incision was made followed by medial parapatellar arthrotomy with advanced medial compartment osteoarthritis, very severe patellofemoral arthritis and very severe lateral compartment arthritis, partial synovectomy was also carried out.   The ACL and PCL and fat pad were excised along with anterior horns of the meniscus. The proximal tibia cutting guide from  the extra medullary system was applied and the proximal tibia cut carried out, with subsequent repeat cut of additional 2 mm.  The distal femoral cut was carried out in a similar fashion with the intramedullary guide     the 6 femoral cutting guide applied with anterior posterior and chamfer cuts made.  The posterior horns of the menisci were removed at this point.   Injection of the above medication was carried out after the femoral and tibial cuts were carried out.  The 5 baseplate trial was placed pinned into position and proximal tibial  preparation carried out with drilling hand reaming and the keel punch followed by placement of the 6 femur and sizing the tibial insert size 10 millimeter gave the best fit with stability and full extension.  The distal femoral drill holes were made in the notch cut for the trochlear groove was then carried out with trials were then removed the patella was cut using the patellar cutting guide and it sized to a size 3 after drill holes have been made  The knee was irrigated with pulsatile lavage and the bony surfaces dried the tibial component was cemented into place first.  Excess cement was removed and the polyethylene insert placed with a torque screw placed with a torque screwdriver tightened.  The distal femoral component was placed and the knee was held in extension as the patellar button was clamped into place.  After the cement was set, excess cement was removed and the knee was again irrigated thoroughly thoroughly irrigated.  The tourniquet was let down and hemostasis checked with electrocautery. The arthrotomy was repaired with a heavy Quill suture,  followed by 3-0 V lock subcuticular closure, skin staples followed by incisional wound VAC and Polar Care.Marland Kitchen   PLAN OF CARE: Admit to inpatient    PATIENT DISPOSITION:  PACU - hemodynamically stable.

## 2020-10-10 LAB — GLUCOSE, CAPILLARY
Glucose-Capillary: 235 mg/dL — ABNORMAL HIGH (ref 70–99)
Glucose-Capillary: 275 mg/dL — ABNORMAL HIGH (ref 70–99)
Glucose-Capillary: 233 mg/dL — ABNORMAL HIGH (ref 70–99)
Glucose-Capillary: 220 mg/dL — ABNORMAL HIGH (ref 70–99)

## 2020-10-10 LAB — BASIC METABOLIC PANEL
Anion gap: 10 (ref 5–15)
BUN: 12 mg/dL (ref 8–23)
CO2: 28 mmol/L (ref 22–32)
Calcium: 8.1 mg/dL — ABNORMAL LOW (ref 8.9–10.3)
Chloride: 96 mmol/L — ABNORMAL LOW (ref 98–111)
Creatinine, Ser: 0.76 mg/dL (ref 0.61–1.24)
GFR, Estimated: >60 mL/min (ref >60)
Glucose, Bld: 214 mg/dL — ABNORMAL HIGH (ref 70–99)
Potassium: 3.7 mmol/L (ref 3.5–5.1)
Sodium: 134 mmol/L — ABNORMAL LOW (ref 135–145)

## 2020-10-10 LAB — CBC
HCT: 32.9 % — ABNORMAL LOW (ref 39.0–52.0)
Hemoglobin: 11.3 g/dL — ABNORMAL LOW (ref 13.0–17.0)
MCH: 30.7 pg (ref 26.0–34.0)
MCHC: 34.3 g/dL (ref 30.0–36.0)
MCV: 89.4 fL (ref 80.0–100.0)
Platelets: 168 10*3/uL (ref 150–400)
RBC: 3.68 MIL/uL — ABNORMAL LOW (ref 4.22–5.81)
RDW: 13.8 % (ref 11.5–15.5)
WBC: 9.4 10*3/uL (ref 4.0–10.5)
nRBC: 0 % (ref 0.0–0.2)

## 2020-10-10 NOTE — Progress Notes (Signed)
Occupational Therapy Treatment Patient Details Name: DEWON MENDIZABAL MRN: [id number] DOB: December 20, 1950 Today's Date: 10/10/2020    History of present illness Choua Vanacker is a 70 y.o. male s/p R TKA. PMH includes: A fib, charcot's arthropathy, diabetic retinopathy, HT, HLD, DM Type 2 with peripheral neuropathy.   OT comments  Pt seen for OT tx this date to f/u re: LB ADLs post-op'ly. OT provides re-ed to pt and spouse regarding: polar care mgt (wear time, ice time), compression stocking mgt (rationale and wear time). Pt demos ability to come to sitting with SUPV and demos competency after education, with connecting/disconnecting polar care. Handout issued. OT notified RN of session contents. Pt back to bed with all needs met. Will continue to follow acutely.    Follow Up Recommendations  No OT follow up    Equipment Recommendations  None recommended by OT    Recommendations for Other Services      Precautions / Restrictions Precautions Precautions: Knee Precaution Booklet Issued: Yes (comment) Restrictions Weight Bearing Restrictions: Yes RLE Weight Bearing: Weight bearing as tolerated       Mobility Bed Mobility Overal bed mobility: Needs Assistance Bed Mobility: Supine to Sit     Supine to sit: Supervision;HOB elevated Sit to supine: Supervision   General bed mobility comments: increased time, HOB slightly elevated, not need for physical assistance.    Transfers Overall transfer level: Needs assistance Equipment used: Rolling walker (2 wheeled) Transfers: Sit to/from Stand Sit to Stand: Supervision         General transfer comment: declines to stand with OT at this time citing fatigue from walking multiple times today.    Balance Overall balance assessment: Needs assistance Sitting-balance support: No upper extremity supported;Feet supported Sitting balance-Leahy Scale: Good     Standing balance support: Bilateral upper extremity supported Standing  balance-Leahy Scale: Fair                             ADL either performed or assessed with clinical judgement   ADL                       Lower Body Dressing: Minimal assistance;Sitting/lateral leans Lower Body Dressing Details (indicate cue type and reason): to practice donning/doffing elastic waist LB clothing (difficulty threading over feet as he is still with some decreaed knee ROM, but he is ultiamtely able to thread w/o AE)                     Vision Patient Visual Report: No change from baseline     Perception     Praxis      Cognition Arousal/Alertness: Awake/alert Behavior During Therapy: WFL for tasks assessed/performed Overall Cognitive Status: Within Functional Limits for tasks assessed                                 General Comments: A&O, appropraite throughout        Exercises Total Joint Exercises Ankle Circles/Pumps: AROM;Both;10 reps;Seated Quad Sets: AROM;Strengthening;Supine;Right;10 reps Hip ABduction/ADduction: AROM;Strengthening;Right;10 reps Straight Leg Raises: AAROM;Strengthening;Right;10 reps;Supine Long Arc Quad: AROM;Seated;Right;10 reps Goniometric ROM: 0-87 AROM. Other Exercises Other Exercises: OT ed re: polar care mgt, compression stocking mgt, knee positioning, importance of OOB activity.   Shoulder Instructions       General Comments      Pertinent Vitals/ Pain  Pain Assessment: Faces Faces Pain Scale: Hurts a little bit Pain Location: Pain with RLE SLR (states "in the quad") Pain Descriptors / Indicators: Cramping Pain Intervention(s): Monitored during session  Home Living Family/patient expects to be discharged to:: Private residence Living Arrangements: Spouse/significant other;Parent Available Help at Discharge: Family Type of Home: House Home Access: Ramped entrance;Level entry     Home Layout: One level;Able to live on main level with bedroom/bathroom     Bathroom  Shower/Tub: Other (comment);Walk-in shower   Bathroom Toilet: Handicapped height Bathroom Accessibility: Yes   Home Equipment: Grab bars - toilet;Walker - 2 wheels;Bedside commode;Grab bars - tub/shower;Shower seat;Cane - single point;Toilet riser;Wheelchair - power;Wheelchair - Brewing technologist - built in          Prior Functioning/Environment Level of Independence: Independent            Frequency  Min 1X/week        Progress Toward Goals  OT Goals(current goals can now be found in the care plan section)  Progress towards OT goals: Progressing toward goals  Acute Rehab OT Goals Patient Stated Goal: go home OT Goal Formulation: With patient/family Time For Goal Achievement: 10/23/20 Potential to Achieve Goals: Good  Plan Discharge plan remains appropriate    Co-evaluation                 AM-PAC OT "6 Clicks" Daily Activity     Outcome Measure   Help from another person eating meals?: None Help from another person taking care of personal grooming?: None Help from another person toileting, which includes using toliet, bedpan, or urinal?: A Little Help from another person bathing (including washing, rinsing, drying)?: A Little Help from another person to put on and taking off regular upper body clothing?: None Help from another person to put on and taking off regular lower body clothing?: A Little 6 Click Score: 21    End of Session    OT Visit Diagnosis: Other abnormalities of gait and mobility (R26.89);Pain Pain - Right/Left: Right Pain - part of body: Knee;Leg   Activity Tolerance Patient tolerated treatment well   Patient Left in bed;with call bell/phone within reach;with family/visitor present   Nurse Communication Patient requests pain meds;Mobility status;Other (comment) (pt request BSC next to him tonight as he is reciving medication for BM. notified that pt demo'ed comptetncy with line mgt including noting his wound vac and disconnecting  his polar care)        Time: 3794-4461 OT Time Calculation (min): 23 min  Charges: OT General Charges $OT Visit: 1 Visit OT Treatments $Self Care/Home Management : 23-37 mins  Gerrianne Scale, [place], OTR/L ascom [phone] 10/10/20, 6:43 PM

## 2020-10-10 NOTE — Progress Notes (Signed)
  Subjective: 1 Day Post-Op Procedure(s) (LRB): TOTAL KNEE ARTHROPLASTY - Rachelle Hora to Assist (Right) Patient reports pain as well-controlled.   Patient is well, and has had no acute complaints or problems Plan is to go Home after hospital stay. Negative for chest pain and shortness of breath Fever: no Gastrointestinal: negative for nausea and vomiting.  Patient has not had a bowel movement.  Objective: Vital signs in last 24 hours: Temp:  [97 F (36.1 C)-98.3 F (36.8 C)] 98.3 F (36.8 C) (07/13 7654) Pulse Rate:  [61-91] 91 (07/13 0613) Resp:  [13-21] 16 (07/13 0613) BP: (105-152)/(64-91) 151/80 (07/13 0613) SpO2:  [96 %-100 %] 98 % (07/13 0613)  Intake/Output from previous day:  Intake/Output Summary (Last 24 hours) at 10/10/2020 0718 Last data filed at 10/10/2020 0619 Gross per 24 hour  Intake 1350 ml  Output 3520 ml  Net -2170 ml    Intake/Output this shift: No intake/output data recorded.  Labs: Recent Labs    10/09/20 1157 10/10/20 0455  HGB 12.6* 11.3*   Recent Labs    10/09/20 1157 10/10/20 0455  WBC 8.0 9.4  RBC 4.23 3.68*  HCT 37.5* 32.9*  PLT 174 168   Recent Labs    10/09/20 1157 10/10/20 0455  NA  --  134*  K  --  3.7  CL  --  96*  CO2  --  28  BUN  --  12  CREATININE 0.73 0.76  GLUCOSE  --  214*  CALCIUM  --  8.1*   No results for input(s): LABPT, INR in the last 72 hours.   EXAM General - Patient is Alert, Appropriate, and Oriented Extremity - Dorsiflexion/Plantar flexion intact Compartment soft Sensation intact to light touch over the saphenous and lateral sural cutaneous, decreased over the superficial fibular and deep fibular distributions  Dressing/Incision -Prevena in place and working, no drainage Motor Function - intact, moving foot and toes well on exam.   Unable to perform SLR Cardiovascular- Regular rate and rhythm, no murmurs/rubs/gallops Respiratory- Lungs clear to auscultation bilaterally Gastrointestinal- soft,  nontender, and active bowel sounds   Assessment/Plan: 1 Day Post-Op Procedure(s) (LRB): TOTAL KNEE ARTHROPLASTY - Rachelle Hora to Assist (Right) Active Problems:   S/P TKR (total knee replacement) using cement, right  Estimated body mass index is 29.43 kg/m as calculated from the following:   Height as of this encounter: 6\' 2"  (1.88 m).   Weight as of this encounter: 104 kg. Advance diet Up with therapy       DVT Prophylaxis - Lovenox, Ted hose, and foot pumps Weight-Bearing as tolerated to right leg  Cassell Smiles, PA-C [facility] 10/10/2020, 7:18 AM

## 2020-10-10 NOTE — Progress Notes (Signed)
Inpatient Diabetes Program Recommendations  AACE/ADA: New Consensus Statement on Inpatient Glycemic Control (2015)  Target Ranges:  Prepandial:   less than 140 mg/dL      Peak postprandial:   less than 180 mg/dL (1-2 hours)      Critically ill patients:  140 - 180 mg/dL   Results for Dillon Henry, Dillon Henry (MRN [id number]) as of 10/10/2020 08:10  Ref. Range 10/09/2020 06:19 10/09/2020 09:53 10/09/2020 12:08 10/09/2020 16:18 10/09/2020 20:44  Glucose-Capillary Latest Ref Range: 70 - 99 mg/dL 179 (H) 169 (H) 159 (H) 207 (H) 258 (H)   Results for Dillon Henry, Dillon Henry (MRN [id number]) as of 10/10/2020 08:10  Ref. Range 10/10/2020 07:32  Glucose-Capillary Latest Ref Range: 70 - 99 mg/dL 235 (H)    Admit R Total Knee  History: DM  Home DM Meds: Glipizide 10 mg Daily       Metformin 1000 mg BID      Regular Insulin 5-12 units TID per SSI      NPH Insulin 15 units BID    Current Orders: Novolog 0-15 units TID    Glipizide 10 mg Daily    Metformin 1000 mg BID   Novolog started yest at 2pm--Glipizide started this AM   MD- Note patient takes NPH Insulin at home.  CBG 235 this AM.  Please consider starting Levemir 8 units BID (50% home dose)  Will need to use Levemir as pharmacy does not carry NPH Insulin    --Will follow patient during hospitalization--  Wyn Quaker RN, MSN, CDE Diabetes Coordinator Inpatient Glycemic Control Team Team Pager: [phone] (8a-5p)

## 2020-10-10 NOTE — Progress Notes (Signed)
Met with the patient and discussed DC plan and needs He has family support at home  He has a rolling walker and a 3 in 1 at home He has transportation and can afford his medications He is set up with [facility] from the surgeons office prior to surgery and they have already called him. No additional needs

## 2020-10-10 NOTE — Progress Notes (Signed)
Physical Therapy Treatment Patient Details Name: Dillon Henry MRN: [id number] DOB: March 10, 1951 Today's Date: 10/10/2020    History of Present Illness Dillon Henry is a 70 y.o. male s/p R TKA. PMH includes: A fib, charcot's arthropathy, diabetic retinopathy, HT, HLD, DM Type 2 with peripheral neuropathy.    PT Comments    Pt received supine in bed with HOB elevated agreeable to PT services. Performed bed level therex and reviewed HEP handout. Knee AROM and PROM measured prior to OOB mobility. Supervision for bed mobility still relying on HoB elevated but no bed rail use or PT need for RLE management. Supervision for STS to RW with improved WB'ing on RLE. Pt displays antalgic gait throughout with limited heel strike on RLE but able to initiate after cuing. Pt demo'd safe use of RW in bathroom and ability to urinate in standing with no LOB with good RW navigation with backwards steps and turns with PT supervision and no cuing required. Pt returned to recliner with RLE in bone foam. Minor reports of pain 2/10 post session. D/c recs remain appropriate at this time. Pt educated to contact spouse to bring in [place] from home so PT can adjust height accordingly prior to D/c.     Follow Up Recommendations  Home health PT;Follow surgeon's recommendation for DC plan and follow-up therapies;Supervision - Intermittent     Equipment Recommendations  None recommended by PT    Recommendations for Other Services       Precautions / Restrictions Precautions Precautions: Knee Precaution Booklet Issued: Yes (comment) Restrictions Weight Bearing Restrictions: Yes RLE Weight Bearing: Weight bearing as tolerated    Mobility  Bed Mobility Overal bed mobility: Needs Assistance Bed Mobility: Supine to Sit     Supine to sit: Supervision;HOB elevated     General bed mobility comments: No reliance on PT for RLE management to sit EOB.    Transfers   Equipment used: Rolling walker (2  wheeled) Transfers: Sit to/from Stand Sit to Stand: Supervision            Ambulation/Gait Ambulation/Gait assistance: Supervision Gait Distance (Feet): 180 Feet Assistive device: Rolling walker (2 wheeled) Gait Pattern/deviations: Step-to pattern;Decreased stance time - right;Decreased step length - left;Decreased dorsiflexion - right     General Gait Details: Decreased heel strike on RLE. After cuing able to initiate RLE heel strike. Heavy reliance on BUE's on RW.   Stairs             Wheelchair Mobility    Modified Rankin (Stroke Patients Only)       Balance Overall balance assessment: Needs assistance Sitting-balance support: No upper extremity supported;Feet supported Sitting balance-Leahy Scale: Good     Standing balance support: Bilateral upper extremity supported Standing balance-Leahy Scale: Fair Standing balance comment: Able to urinate in bathroom standing at RW with SUE support             High level balance activites: Backward walking;Direction changes High Level Balance Comments: Supervision, able to sequence backwards steps and turning left to exit bathroom.            Cognition Arousal/Alertness: Awake/alert Behavior During Therapy: WFL for tasks assessed/performed Overall Cognitive Status: Within Functional Limits for tasks assessed                                        Exercises Total Joint Exercises Ankle Circles/Pumps: AROM;Both;10 reps;Supine Quad Sets:  AROM;Strengthening;Both;Supine;Right Short Arc Quad: AAROM;Strengthening;Right;10 reps;Supine Heel Slides: AROM;Right;10 reps;Supine Hip ABduction/ADduction: AROM;Strengthening;Right;10 reps Straight Leg Raises: AAROM;Strengthening;Right;10 reps;Supine Long Arc Quad: AROM;Seated;Right;10 reps Goniometric ROM: 0-72 AROM. PROM flex 80 Other Exercises Other Exercises: Pt educated to bring in [place] from home so PT can appropriately set up correct height for pt  prior to D/C.    General Comments        Pertinent Vitals/Pain Pain Assessment: 0-10 Pain Score: 2  Pain Location: post session pain Pain Intervention(s): Limited activity within patient's tolerance;Monitored during session;Repositioned    Home Living                      Prior Function            PT Goals (current goals can now be found in the care plan section) Acute Rehab PT Goals Patient Stated Goal: go home PT Goal Formulation: With patient/family Time For Goal Achievement: 10/23/20 Potential to Achieve Goals: Good Progress towards PT goals: Progressing toward goals    Frequency    BID      PT Plan      Co-evaluation              AM-PAC PT "6 Clicks" Mobility   Outcome Measure  Help needed turning from your back to your side while in a flat bed without using bedrails?: A Little Help needed moving from lying on your back to sitting on the side of a flat bed without using bedrails?: A Little Help needed moving to and from a bed to a chair (including a wheelchair)?: A Little Help needed standing up from a chair using your arms (e.g., wheelchair or bedside chair)?: A Little Help needed to walk in hospital room?: A Little Help needed climbing 3-5 steps with a railing? : A Lot 6 Click Score: 17    End of Session Equipment Utilized During Treatment: Gait belt Activity Tolerance: Patient tolerated treatment well Patient left: in chair;with chair alarm set;with SCD's reapplied;with call bell/phone within reach Nurse Communication: Mobility status PT Visit Diagnosis: Unsteadiness on feet (R26.81);Other abnormalities of gait and mobility (R26.89);Muscle weakness (generalized) (M62.81);Difficulty in walking, not elsewhere classified (R26.2)     Time: 2376-2831 PT Time Calculation (min) (ACUTE ONLY): 43 min  Charges:  $Gait Training: 23-37 mins $Therapeutic Exercise: 8-22 mins                     Sabiha Sura M. Fairly IV, PT, DPT Physical Therapist-  [facility]  10/10/2020, 12:06 PM

## 2020-10-10 NOTE — TOC Progression Note (Signed)
Transition of Care [facility]) - Progression Note    Patient Details  Name: Dillon Henry MRN: [id number] Date of Birth: 1950/07/09  Transition of Care [facility]) CM/SW [place], RN Phone Number: 10/10/2020, 3:49 PM  Clinical Narrative:    Spoke with the patient on the phone, He agreed that the rolling walker he had his wife bring was too small, They have another one at home and he will have her bring it in, He stated he wanted to wait until tomorrow to determine if he needs another one that is taller, he stated that he has transport chairs and power chairs and a lot of other DME and would really want to avoid getting another one if possible, I explained that if needed we could get just the extensions and he would not need to get the entire walker, He requested to wait to make a decision until morning   Expected Discharge Plan: [place] Barriers to Discharge: Barriers Resolved  Expected Discharge Plan and Services Expected Discharge Plan: [place]   Discharge Planning Services: CM Consult   Living arrangements for the past 2 months: Single Family Home                 DME Arranged: N/A         HH Arranged: PT HH Agency: [facility] (now [facility]) Date [place]: 10/10/20 Time [place]: [phone] Representative spoke with at [place]: [place]   Social Determinants of Health ([place]) Interventions    Readmission Risk Interventions No flowsheet data found.

## 2020-10-10 NOTE — Progress Notes (Signed)
Physical Therapy Treatment Patient Details Name: Dillon Henry MRN: [id number] DOB: [birth year] Today's Date: 10/10/2020    History of Present Illness Dillon Henry is a 70 y.o. male s/p R TKA. PMH includes: A fib, charcot's arthropathy, diabetic retinopathy, HT, HLD, DM Type 2 with peripheral neuropathy.    PT Comments    Pt received sitting up in bed agreeable to PT afternoon session. Reinforced SLR, hip abduction and LAQ's due to difficulty previous session. Remains AAROM for SLR however < 25% assist from PT. Supervision for all bed mobility, t/f and walking. Pt demonstrating improved heel strike with min VC's (2x) to reinforce and improved gait speed ambulating > 300' along with less reliance on BUE's. Safe hand placement for all transfers. Min VC's to maintain RLE inside BOS of RW to optimize safety to reduce tripping hazard. Pt and spouse educated on possible need for RW d/t RW from home being too small for pt. Case manager and Dr. Rudene Christians notified. Current d/c recs remain appropriate.     Follow Up Recommendations  Home health PT;Follow surgeon's recommendation for DC plan and follow-up therapies;Supervision - Intermittent     Equipment Recommendations  Rolling walker with 5" wheels    Recommendations for Other Services       Precautions / Restrictions Precautions Precautions: Knee Precaution Booklet Issued: Yes (comment) Restrictions Weight Bearing Restrictions: Yes RLE Weight Bearing: Weight bearing as tolerated    Mobility  Bed Mobility Overal bed mobility: Needs Assistance Bed Mobility: Supine to Sit     Supine to sit: Supervision;HOB elevated Sit to supine: Supervision   General bed mobility comments: NO reliance on PT for getting in<>out of bed.    Transfers Overall transfer level: Needs assistance Equipment used: Rolling walker (2 wheeled) Transfers: Sit to/from Stand Sit to Stand: Supervision         General transfer comment: Good carryover with  safe hand placement.  Ambulation/Gait Ambulation/Gait assistance: Supervision Gait Distance (Feet): 380 Feet Assistive device: Rolling walker (2 wheeled) Gait Pattern/deviations: Step-to pattern;Decreased stance time - right;Decreased step length - left;Decreased dorsiflexion - right Gait velocity: Improved gait speed compared to a.m. session   General Gait Details: Less reliance on Ue's on afternoon session.   Stairs             Wheelchair Mobility    Modified Rankin (Stroke Patients Only)       Balance Overall balance assessment: Needs assistance Sitting-balance support: No upper extremity supported;Feet supported Sitting balance-Leahy Scale: Good     Standing balance support: Bilateral upper extremity supported Standing balance-Leahy Scale: Fair Standing balance comment: Able to urinate in bathroom standing at RW with SUE support             High level balance activites: Backward walking;Direction changes High Level Balance Comments: Supervision, able to sequence backwards steps and turning left to exit bathroom.            Cognition Arousal/Alertness: Awake/alert Behavior During Therapy: WFL for tasks assessed/performed Overall Cognitive Status: Within Functional Limits for tasks assessed                                        Exercises Total Joint Exercises Ankle Circles/Pumps: AROM;Both;10 reps;Seated Quad Sets: AROM;Strengthening;Supine;Right;10 reps Short Arc Quad: AAROM;Strengthening;Right;10 reps;Supine Heel Slides: AROM;Right;10 reps;Supine Hip ABduction/ADduction: AROM;Strengthening;Right;10 reps Straight Leg Raises: AAROM;Strengthening;Right;10 reps;Supine Long Arc Quad: AROM;Seated;Right;10 reps Goniometric ROM: 0-87 AROM. Other  Exercises Other Exercises: Pt educated on remaining cautious keeping LE's within BOS of RW to reduce risk of falls.    General Comments        Pertinent Vitals/Pain Pain Assessment:  Faces Pain Score: 2  Faces Pain Scale: Hurts a little bit Pain Location: Pain with RLE SLR Pain Intervention(s): Limited activity within patient's tolerance;Monitored during session;Repositioned;Ice applied    Home Living Family/patient expects to be discharged to:: Private residence Living Arrangements: Spouse/significant other;Parent Available Help at Discharge: Family Type of Home: House Home Access: Ramped entrance;Level entry   Home Layout: One level;Able to live on main level with bedroom/bathroom Home Equipment: Grab bars - toilet;Walker - 2 wheels;Bedside commode;Grab bars - tub/shower;Shower seat;Cane - single point;Toilet riser;Wheelchair - power;Wheelchair - Brewing technologist - built in      Prior Function Level of Independence: Independent          PT Goals (current goals can now be found in the care plan section) Acute Rehab PT Goals Patient Stated Goal: go home PT Goal Formulation: With patient/family Time For Goal Achievement: 10/23/20 Potential to Achieve Goals: Good Progress towards PT goals: Progressing toward goals    Frequency    BID      PT Plan      Co-evaluation              AM-PAC PT "6 Clicks" Mobility   Outcome Measure  Help needed turning from your back to your side while in a flat bed without using bedrails?: A Little Help needed moving from lying on your back to sitting on the side of a flat bed without using bedrails?: A Little Help needed moving to and from a bed to a chair (including a wheelchair)?: A Little Help needed standing up from a chair using your arms (e.g., wheelchair or bedside chair)?: A Little Help needed to walk in hospital room?: A Little Help needed climbing 3-5 steps with a railing? : A Little 6 Click Score: 18    End of Session Equipment Utilized During Treatment: Gait belt Activity Tolerance: Patient tolerated treatment well Patient left: in chair;with chair alarm set;with SCD's reapplied;with call  bell/phone within reach Nurse Communication: Mobility status PT Visit Diagnosis: Unsteadiness on feet (R26.81);Other abnormalities of gait and mobility (R26.89);Muscle weakness (generalized) (M62.81);Difficulty in walking, not elsewhere classified (R26.2)     Time: 3299-2426 PT Time Calculation (min) (ACUTE ONLY): 28 min  Charges:  $Gait Training: 8-22 mins $Therapeutic Exercise: 8-22 mins                    Jazaria Jarecki M. Fairly IV, PT, DPT Physical Therapist- [facility]  10/10/2020, 3:40 PM

## 2020-10-10 NOTE — Anesthesia Postprocedure Evaluation (Signed)
Anesthesia Post Note  Patient: Dillon Henry  Procedure(s) Performed: TOTAL KNEE ARTHROPLASTY - Rachelle Hora to Assist (Right: Knee)  Patient location during evaluation: Nursing Unit Anesthesia Type: Spinal Level of consciousness: awake, awake and alert and oriented Pain management: pain level controlled Vital Signs Assessment: post-procedure vital signs reviewed and stable Respiratory status: respiratory function stable Cardiovascular status: stable Postop Assessment: no headache, no backache, patient able to bend at knees, no apparent nausea or vomiting, able to ambulate and adequate PO intake Anesthetic complications: no   No notable events documented.   Last Vitals:  Vitals:   10/10/20 0731 10/10/20 1124  BP: (!) 142/74 124/65  Pulse: 89 81  Resp: 16 15  Temp: 36.7 C 36.9 C  SpO2: 100% 96%    Last Pain:  Vitals:   10/10/20 1138  TempSrc:   PainSc: 4                  Lanora Manis

## 2020-10-11 LAB — GLUCOSE, CAPILLARY
Glucose-Capillary: 216 mg/dL — ABNORMAL HIGH (ref 70–99)
Glucose-Capillary: 267 mg/dL — ABNORMAL HIGH (ref 70–99)
Glucose-Capillary: 246 mg/dL — ABNORMAL HIGH (ref 70–99)
Glucose-Capillary: 230 mg/dL — ABNORMAL HIGH (ref 70–99)

## 2020-10-11 MED ORDER — ENOXAPARIN SODIUM 40 MG/0.4ML IJ SOSY: 40.0000 mg | PREFILLED_SYRINGE | INTRAMUSCULAR | 0 refills | Status: DC

## 2020-10-11 MED ORDER — HYDROCODONE-ACETAMINOPHEN 5-325 MG PO TABS: 1.0000 | ORAL_TABLET | ORAL | 0 refills | Status: DC | PRN

## 2020-10-11 NOTE — TOC Progression Note (Signed)
Transition of Care [facility]) - Progression Note    Patient Details  Name: Dillon Henry MRN: [id number] Date of Birth: 04/07/1950  Transition of Care [facility]) CM/SW [place], RN Phone Number: 10/11/2020, 9:53 AM  Clinical Narrative:    The patient's wife brought in a rolling walker for the patient and it will work for the size he needs, he is set up with [facility] and has no additional needs   Expected Discharge Plan: [place] Barriers to Discharge: Barriers Resolved  Expected Discharge Plan and Services Expected Discharge Plan: [place]   Discharge Planning Services: CM Consult   Living arrangements for the past 2 months: Single Family Home                 DME Arranged: N/A         HH Arranged: PT HH Agency: [place] (now [facility]) Date [place]: 10/10/20 Time [place]: [phone] Representative spoke with at [place]: [place]   Social Determinants of Health ([place]) Interventions    Readmission Risk Interventions No flowsheet data found.

## 2020-10-11 NOTE — Progress Notes (Signed)
  Subjective: 2 Days Post-Op Procedure(s) (LRB): TOTAL KNEE ARTHROPLASTY - Rachelle Hora to Assist (Right) Patient reports pain as well-controlled.   Patient is well, but has had some minor complaints of difficulty with SLR. Plan is to go Home after hospital stay. Negative for chest pain and shortness of breath Fever: no Gastrointestinal: negative for nausea and vomiting.  Patient has not had a bowel movement.  Objective: Vital signs in last 24 hours: Temp:  [98.1 F (36.7 C)-98.4 F (36.9 C)] 98.4 F (36.9 C) (07/14 0740) Pulse Rate:  [81-98] 98 (07/14 0740) Resp:  [15-18] 17 (07/14 0740) BP: (124-158)/(65-78) 128/72 (07/14 0740) SpO2:  [96 %-100 %] 100 % (07/14 0740)  Intake/Output from previous day:  Intake/Output Summary (Last 24 hours) at 10/11/2020 0849 Last data filed at 10/11/2020 0305 Gross per 24 hour  Intake 729.42 ml  Output --  Net 729.42 ml    Intake/Output this shift: No intake/output data recorded.  Labs: Recent Labs    10/09/20 1157 10/10/20 0455  HGB 12.6* 11.3*   Recent Labs    10/09/20 1157 10/10/20 0455  WBC 8.0 9.4  RBC 4.23 3.68*  HCT 37.5* 32.9*  PLT 174 168   Recent Labs    10/09/20 1157 10/10/20 0455  NA  --  134*  K  --  3.7  CL  --  96*  CO2  --  28  BUN  --  12  CREATININE 0.73 0.76  GLUCOSE  --  214*  CALCIUM  --  8.1*   No results for input(s): LABPT, INR in the last 72 hours.   EXAM General - Patient is Alert, Appropriate, and Oriented Extremity - Dorsiflexion/Plantar flexion intact Compartment soft Sensation intact to light touch over the saphenous and lateral sural cutaneous, decreased over the superficial fibular and deep fibular distributions  Dressing/Incision -Prevena in place and working, no drainage noted Motor Function - intact, moving foot and toes well on exam. Able to perform SLR with assistance.   Cardiovascular- Regular rate and rhythm, no murmurs/rubs/gallops Respiratory- Lungs clear to auscultation  bilaterally Gastrointestinal- soft, nontender, and active bowel sounds   Assessment/Plan: 2 Days Post-Op Procedure(s) (LRB): TOTAL KNEE ARTHROPLASTY - Rachelle Hora to Assist (Right) Active Problems:   S/P TKR (total knee replacement) using cement, right  Estimated body mass index is 29.43 kg/m as calculated from the following:   Height as of this encounter: 6\' 2"  (1.88 m).   Weight as of this encounter: 104 kg. Advance diet Up with therapy Discharge home with home health pending completion of therapy goals and BM    Prevena portable unit attached.  DVT Prophylaxis - Lovenox, Ted hose, and foot pumps Weight-Bearing as tolerated to right leg  Cassell Smiles, PA-C [facility] 10/11/2020, 8:49 AM

## 2020-10-11 NOTE — Progress Notes (Signed)
Physical Therapy Treatment Patient Details Name: Dillon Henry MRN: [id number] DOB: 08/10/1950 Today's Date: 10/11/2020    History of Present Illness Dillon Henry is a 70 y.o. male s/p R TKA. PMH includes: A fib, charcot's arthropathy, diabetic retinopathy, HT, HLD, DM Type 2 with peripheral neuropathy.    PT Comments    Pt received in bed agreeable to p.m. session wishing to ambulate but deferring HEP handout exercises due to increase in R knee pain. Pt remains supervision for bed mobility, transfers, and ambulation. Pt still displaying limitations in R heel strike with ambulation and heavy reliance on UE's on RW. D/c recs remain appropriate. PT will continue to work with pt during current admission to further R quad strength and consistent heel strike to improve gait mechanics.    Follow Up Recommendations  Home health PT;Follow surgeon's recommendation for DC plan and follow-up therapies;Supervision - Intermittent     Equipment Recommendations  Rolling walker with 5" wheels    Recommendations for Other Services       Precautions / Restrictions Precautions Precautions: Knee Precaution Booklet Issued: Yes (comment) Restrictions Weight Bearing Restrictions: Yes RLE Weight Bearing: Weight bearing as tolerated    Mobility  Bed Mobility Overal bed mobility: Needs Assistance Bed Mobility: Supine to Sit     Supine to sit: Supervision;HOB elevated Sit to supine: Supervision   General bed mobility comments: increased time, HOB slightly elevated, not need for physical assistance.    Transfers Overall transfer level: Needs assistance Equipment used: Rolling walker (2 wheeled) Transfers: Sit to/from Stand Sit to Stand: Supervision Stand pivot transfers: Min assist          Ambulation/Gait Ambulation/Gait assistance: Supervision Gait Distance (Feet): 200 Feet Assistive device: Rolling walker (2 wheeled) Gait Pattern/deviations: Step-to pattern;Decreased stance  time - right;Decreased step length - left;Decreased dorsiflexion - right     General Gait Details: Still limited in RLE heel strike but able to perform with mod to max VC's.   Stairs             Wheelchair Mobility    Modified Rankin (Stroke Patients Only)       Balance Overall balance assessment: Needs assistance Sitting-balance support: No upper extremity supported;Feet supported Sitting balance-Leahy Scale: Good     Standing balance support: Bilateral upper extremity supported Standing balance-Leahy Scale: Fair                              Cognition Arousal/Alertness: Awake/alert Behavior During Therapy: WFL for tasks assessed/performed Overall Cognitive Status: Within Functional Limits for tasks assessed                                        Exercises Total Joint Exercises Ankle Circles/Pumps: AROM;Both;10 reps;Seated Quad Sets: AROM;Strengthening;Supine;Right;10 reps Short Arc Quad: AAROM;Strengthening;Right;10 reps;Supine Heel Slides: AROM;Right;10 reps;Supine Straight Leg Raises: AAROM;Strengthening;Right;10 reps;Supine Long Arc Quad: AROM;Seated;Right;10 reps Goniometric ROM: 1-90 AROM Other Exercises Other Exercises: ADjusted RW to appropraite height that pt brought in from home.    General Comments        Pertinent Vitals/Pain Pain Assessment: Faces Faces Pain Scale: Hurts a little bit Pain Intervention(s): Limited activity within patient's tolerance;Monitored during session;Premedicated before session;Repositioned    Home Living Family/patient expects to be discharged to:: Private residence Living Arrangements: Spouse/significant other;Parent   Type of Home: House Home Access: Ramped entrance;Level entry  Home Layout: One level;Able to live on main level with bedroom/bathroom Home Equipment: Grab bars - toilet;Walker - 2 wheels;Bedside commode;Grab bars - tub/shower;Shower seat;Cane - single point;Toilet  riser;Wheelchair - power;Wheelchair - Brewing technologist - built in      Prior Function Level of Independence: Independent          PT Goals (current goals can now be found in the care plan section) Acute Rehab PT Goals Patient Stated Goal: go home PT Goal Formulation: With patient/family Time For Goal Achievement: 10/23/20 Potential to Achieve Goals: Good Progress towards PT goals: Progressing toward goals    Frequency    BID      PT Plan Current plan remains appropriate    Co-evaluation              AM-PAC PT "6 Clicks" Mobility   Outcome Measure  Help needed turning from your back to your side while in a flat bed without using bedrails?: A Little Help needed moving from lying on your back to sitting on the side of a flat bed without using bedrails?: A Little Help needed moving to and from a bed to a chair (including a wheelchair)?: A Little Help needed standing up from a chair using your arms (e.g., wheelchair or bedside chair)?: A Little Help needed to walk in hospital room?: A Little Help needed climbing 3-5 steps with a railing? : A Little 6 Click Score: 18    End of Session Equipment Utilized During Treatment: Gait belt Activity Tolerance: Patient tolerated treatment well Patient left: in bed;with family/visitor present;with call bell/phone within reach Nurse Communication: Mobility status PT Visit Diagnosis: Unsteadiness on feet (R26.81);Other abnormalities of gait and mobility (R26.89);Muscle weakness (generalized) (M62.81);Difficulty in walking, not elsewhere classified (R26.2)     Time: 4098-1191 PT Time Calculation (min) (ACUTE ONLY): 13 min  Charges:  $Gait Training: 8-22 mins $Therapeutic Exercise: 8-22 mins                    Dillon Henry M. Fairly IV, PT, DPT Physical Therapist- [facility]  10/11/2020, 2:04 PM

## 2020-10-11 NOTE — Progress Notes (Signed)
PT Cancellation Note  Patient Details Name: Dillon Henry MRN: [id number] DOB: 1950/12/16   Cancelled Treatment:    Reason Eval/Treat Not Completed: Other (comment) Upon entry on room pt in bathroom requesting PT to return at a later time. Will re-attempt later this a.m. as able.   Salem Caster. Fairly IV, PT, DPT Physical Therapist- [facility]  10/11/2020, 9:15 AM

## 2020-10-11 NOTE — Progress Notes (Signed)
Physical Therapy Treatment Patient Details Name: Dillon Henry MRN: [id number] DOB: 05-Feb-1951 Today's Date: 10/11/2020    History of Present Illness Dillon Henry is a 69 y.o. male s/p R TKA. PMH includes: A fib, charcot's arthropathy, diabetic retinopathy, HT, HLD, DM Type 2 with peripheral neuropathy.    PT Comments    Pt received supine agreeable to PT services with nursing in the room. Supervision for all bed mobility and OOB mobility. Prior to OOB mobility, PT adjusted RW brought in from home to correct height. Pt continues to demo ability to amb 300' with RW with min-mod VC's to facilitate RLE heel strike with gait. Intermittent VC's for staying closer to RW within BOS. Pt continuing to have difficulty with SLR, short arc quads, and long arc quads but pt does demo quad activation just limited strength. Current D/c recs remain appropriate to optimize strength, functional mobility, and AROM.     Follow Up Recommendations  Home health PT;Follow surgeon's recommendation for DC plan and follow-up therapies;Supervision - Intermittent     Equipment Recommendations  Rolling walker with 5" wheels    Recommendations for Other Services       Precautions / Restrictions Precautions Precautions: Knee Precaution Booklet Issued: Yes (comment) Restrictions Weight Bearing Restrictions: Yes RLE Weight Bearing: Weight bearing as tolerated    Mobility  Bed Mobility Overal bed mobility: Needs Assistance Bed Mobility: Supine to Sit     Supine to sit: Supervision;HOB elevated Sit to supine: Supervision   General bed mobility comments: increased time, HOB slightly elevated, not need for physical assistance.    Transfers Overall transfer level: Needs assistance Equipment used: Rolling walker (2 wheeled) Transfers: Sit to/from Stand Sit to Stand: Supervision            Ambulation/Gait Ambulation/Gait assistance: Supervision Gait Distance (Feet): 360 Feet Assistive  device: Rolling walker (2 wheeled) Gait Pattern/deviations: Step-to pattern;Decreased stance time - right;Decreased step length - left;Decreased dorsiflexion - right     General Gait Details: Still limited in RLE heel strike but able to perform with mod to max VC's.   Stairs             Wheelchair Mobility    Modified Rankin (Stroke Patients Only)       Balance Overall balance assessment: Needs assistance Sitting-balance support: No upper extremity supported;Feet supported Sitting balance-Leahy Scale: Good     Standing balance support: Bilateral upper extremity supported Standing balance-Leahy Scale: Fair                              Cognition Arousal/Alertness: Awake/alert Behavior During Therapy: WFL for tasks assessed/performed Overall Cognitive Status: Within Functional Limits for tasks assessed                                        Exercises Total Joint Exercises Ankle Circles/Pumps: AROM;Both;10 reps;Seated Quad Sets: AROM;Strengthening;Supine;Right;10 reps Short Arc Quad: AAROM;Strengthening;Right;10 reps;Supine Heel Slides: AROM;Right;10 reps;Supine Straight Leg Raises: AAROM;Strengthening;Right;10 reps;Supine Long Arc Quad: AROM;Seated;Right;10 reps Goniometric ROM: 1-90 AROM Other Exercises Other Exercises: ADjusted RW to appropraite height that pt brought in from home.    General Comments        Pertinent Vitals/Pain Pain Assessment: Faces Faces Pain Scale: Hurts a little bit    Home Living  Prior Function            PT Goals (current goals can now be found in the care plan section) Acute Rehab PT Goals Patient Stated Goal: go home PT Goal Formulation: With patient/family Time For Goal Achievement: 10/23/20 Potential to Achieve Goals: Good Progress towards PT goals: Progressing toward goals    Frequency    BID      PT Plan Current plan remains appropriate     Co-evaluation              AM-PAC PT "6 Clicks" Mobility   Outcome Measure  Help needed turning from your back to your side while in a flat bed without using bedrails?: A Little Help needed moving from lying on your back to sitting on the side of a flat bed without using bedrails?: A Little Help needed moving to and from a bed to a chair (including a wheelchair)?: A Little Help needed standing up from a chair using your arms (e.g., wheelchair or bedside chair)?: A Little Help needed to walk in hospital room?: A Little Help needed climbing 3-5 steps with a railing? : A Little 6 Click Score: 18    End of Session Equipment Utilized During Treatment: Gait belt Activity Tolerance: Patient tolerated treatment well Patient left: in bed;with family/visitor present;with call bell/phone within reach Nurse Communication: Mobility status PT Visit Diagnosis: Unsteadiness on feet (R26.81);Other abnormalities of gait and mobility (R26.89);Muscle weakness (generalized) (M62.81);Difficulty in walking, not elsewhere classified (R26.2)     Time: 7340-3709 PT Time Calculation (min) (ACUTE ONLY): 32 min  Charges:  $Gait Training: 8-22 mins $Therapeutic Exercise: 8-22 mins                    Vyron Fronczak M. Fairly IV, PT, DPT Physical Therapist- [facility]  10/11/2020, 11:57 AM

## 2020-10-11 NOTE — Discharge Summary (Addendum)
Physician Discharge Summary  Patient ID: MINER KORAL MRN: [id number] DOB/AGE: 69-20-1952 70 y.o.  Admit date: 10/09/2020 Discharge date: 10/12/2020  Admission Diagnoses:  S/P TKR (total knee replacement) using cement, right [Z96.651]  Surgeries:Procedure(s): Right TOTAL KNEE ARTHROPLASTY   SURGEON: Laurene Footman, MD   ASSISTANTS: Rachelle Hora, PA-C   ANESTHESIA:   spinal   EBL:  100   BLOOD ADMINISTERED:none   DRAINS:  Incisional wound VAC     LOCAL MEDICATIONS USED:  MARCAINE   morphine and Exparel   SPECIMEN:  No Specimen   DISPOSITION OF SPECIMEN:  N/A   COUNTS:  YES   TOURNIQUET: 75 minutes at 300 mm Hg   IMPLANTS: Medacta  GMK sphere system with 6 right femur, 5 right tibia with short stem and 10 mm insert.  Size 3 patella, all components cemented.  Discharge Diagnoses: Patient Active Problem List   Diagnosis Date Noted   S/P TKR (total knee replacement) using cement, right 10/09/2020   HAMMER TOE 01/25/2010   UNSPECIFIED VITAMIN D DEFICIENCY 01/31/2009   ERECTILE DYSFUNCTION 10/27/2007   KNEE PAIN, RIGHT 06/15/2007   DIABETES MELLITUS, TYPE II 03/08/2007   HYPERLIPIDEMIA 03/08/2007   HYPERTENSION 03/08/2007   FIBRILLATION, ATRIAL 03/05/2007    Past Medical History:  Diagnosis Date   Arthritis    osteoarthritis   Charcot's arthropathy    Diabetes mellitus 1994   Type II   Diabetic retinopathy ([place])    Dysrhythmia    AF, history of an ablation in 2005   Hammer toe of left foot    Hyperlipidemia 05/1997   Hypertension 1995   Neuromuscular disorder ([place])    polyneuropathies d/t diabetes   Osteomyelitis ([place]) 01/2020   left great toe   Skin ulcer ([place]) 01/2020   full thickness ulceration over IPJ.   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 male who was admitted 10/09/2020 with a diagnosis of  right knee osteoarthritis and went to the operating room on 10/09/2020 and underwent right total knee arthroplasty. The patient received perioperative antibiotics for prophylaxis (see below). The patient tolerated the procedure well and was transported to PACU in stable condition. After meeting PACU criteria, the patient was subsequently transferred to the Orthopaedics/Rehabilitation unit.   The patient received DVT prophylaxis in the form of early mobilization, Aspirin, Lovenox, Foot Pumps, and TED hose. A sacral pad had been placed and heels were elevated off of the bed with rolled towels in order to protect skin integrity. Foley catheter was discontinued on postoperative day #0. The surgical incision was healing well without signs of infection.  Physical therapy was initiated postoperatively for transfers, gait training, and strengthening. Occupational therapy was initiated for activities of daily living and evaluation for assisted devices. Rehabilitation goals were reviewed in detail with the patient. The patient made steady progress with physical therapy and physical therapy recommended discharge to Home.   The patient achieved the preliminary goals of this hospitalization and was felt to be medically and orthopaedically appropriate for discharge.  He was given perioperative antibiotics:  Anti-infectives (From admission, onward)    Start     Dose/Rate Route Frequency Ordered Stop   10/09/20 1400  ceFAZolin (ANCEF) IVPB 2g/100 mL premix        2 g 200 mL/hr over 30 Minutes Intravenous Every 6 hours 10/09/20 1137 10/09/20  2228   10/09/20 0615  ceFAZolin (ANCEF) 2-4 GM/100ML-% IVPB       Note to Pharmacy: Jordan Hawks  : cabinet override      10/09/20 0615 10/09/20 0754   10/09/20 0600  ceFAZolin (ANCEF) IVPB 2g/100 mL premix        2 g 200 mL/hr over 30 Minutes Intravenous On call to O.R. 10/09/20 0136 10/09/20 0753     . Recent vital signs:  Vitals:   10/12/20 0420 10/12/20 0742   BP: 131/66 127/72  Pulse: 83 83  Resp: 16 19  Temp: 98.2 F (36.8 C) 98.2 F (36.8 C)  SpO2: 96% 98%    Recent laboratory studies:  Recent Labs    10/09/20 1157 10/10/20 0455  WBC 8.0 9.4  HGB 12.6* 11.3*  HCT 37.5* 32.9*  PLT 174 168  K  --  3.7  CL  --  96*  CO2  --  28  BUN  --  12  CREATININE 0.73 0.76  GLUCOSE  --  214*  CALCIUM  --  8.1*    Diagnostic Studies: DG Knee 1-2 Views Right  Result Date: 10/09/2020 CLINICAL DATA:  Right total knee arthroplasty. EXAM: RIGHT KNEE - 1-2 VIEW COMPARISON:  None. FINDINGS: The components are well seated. No complicating features are identified. IMPRESSION: Well seated components of a total right knee arthroplasty. Electronically Signed   By: Marijo Sanes M.D.   On: 10/09/2020 10:51    Discharge Medications:   Allergies as of 10/12/2020       Reactions   Lisinopril Cough   Pioglitazone Other (See Comments)   TOOTH PAIN   Rosiglitazone Other (See Comments)   WEIGHT GAIN        Medication List     TAKE these medications    acetaminophen 500 MG tablet Commonly known as: TYLENOL Take 1,000 mg by mouth every 8 (eight) hours.   amLODipine 5 MG tablet Commonly known as: NORVASC Take 5 mg by mouth at bedtime.   aspirin EC 81 MG tablet Take 81 mg by mouth daily.   enoxaparin 40 MG/0.4ML injection Commonly known as: LOVENOX Inject 0.4 mLs (40 mg total) into the skin daily for 14 days.   gabapentin 300 MG capsule Commonly known as: NEURONTIN Take 300 mg by mouth at bedtime.   glipiZIDE 10 MG tablet Commonly known as: GLUCOTROL Take 10 mg by mouth daily before breakfast.   hydrochlorothiazide 25 MG tablet Commonly known as: HYDRODIURIL Take 25 mg by mouth daily.   HYDROcodone-acetaminophen 5-325 MG tablet Commonly known as: NORCO/VICODIN Take 1-2 tablets by mouth every 4 (four) hours as needed for moderate pain (pain score 4-6).   insulin NPH Human 100 UNIT/ML injection Commonly known as: NOVOLIN  N Inject 15 Units into the skin 2 (two) times daily.   insulin regular 100 units/mL injection Commonly known as: NOVOLIN R Inject 5-12 Units into the skin 3 (three) times daily as needed for high blood sugar.   LIQUID TEARS OP Place 1 drop into both eyes daily as needed (dryness/irritation).   losartan 50 MG tablet Commonly known as: COZAAR Take 50 mg by mouth every morning.   magnesium oxide 400 MG tablet Commonly known as: MAG-OX Take 400 mg by mouth every evening.   metFORMIN 1000 MG tablet Commonly known as: GLUCOPHAGE Take 1,000 mg by mouth 2 (two) times daily with a meal.   pravastatin 20 MG tablet Commonly known as: PRAVACHOL Take 20 mg by mouth at bedtime.  zinc gluconate 50 MG tablet Take 50 mg by mouth daily.               Durable Medical Equipment  (From admission, onward)           Start     Ordered   10/09/20 1138  DME Walker rolling  Once       Question Answer Comment  Walker: With 5 Inch Wheels   Patient needs a walker to treat with the following condition S/P TKR (total knee replacement) using cement, right      10/09/20 1137   10/09/20 1138  DME 3 n 1  Once        10/09/20 1137   10/09/20 1138  DME Bedside commode  Once       Question:  Patient needs a bedside commode to treat with the following condition  Answer:  S/P TKR (total knee replacement) using cement, right   10/09/20 1137           Disposition: Home with home health PT   Follow-up Information     Duanne Guess, PA-C. Go on 10/23/2020.   Specialties: Orthopedic Surgery, Emergency Medicine Why: Appt for  staple removal;  @ 2:45 pm;  PT to follow at 3:30 pm Contact information: [place] [address] [phone]                 Raquel Arturo Freundlich, PA-C 10/12/2020, 8:01 AM

## 2020-10-11 NOTE — Progress Notes (Signed)
PT Cancellation Note  Patient Details Name: WHITT AULETTA MRN: [id number] DOB: 05/12/1950   Cancelled Treatment:    Reason Eval/Treat Not Completed: Other (comment). Pt requesting PT to return at later time for p.m. session due to need to use restroom. PT will re-attempt as time allows.   Salem Caster. Fairly IV, PT, DPT Physical Therapist- [facility]  10/11/2020, 1:24 PM

## 2020-10-12 LAB — GLUCOSE, CAPILLARY: Glucose-Capillary: 234 mg/dL — ABNORMAL HIGH (ref 70–99)

## 2020-10-12 NOTE — Care Management Important Message (Signed)
Important Message  Patient Details  Name: Dillon Henry MRN: [id number] Date of Birth: 05-23-1950   Medicare Important Message Given:  Yes     [name] 10/12/2020, 10:20 AM

## 2020-10-12 NOTE — Progress Notes (Signed)
Physical Therapy Treatment Patient Details Name: Dillon Henry MRN: [id number] DOB: 07-31-1950 Today's Date: 10/12/2020    History of Present Illness Dillon Henry is a 70 y.o. male s/p R TKA. PMH includes: A fib, charcot's arthropathy, diabetic retinopathy, HT, HLD, DM Type 2 with peripheral neuropathy.    PT Comments    Pt received in recliner agreeable to PT. Pt mod-I for polar care management. Seated therex performed requiring AAROM for LAQ's and hip flexion on RLE to reach full AROM. Pt maintains supervision with transfers and ambulation with RW. Continues to require mod-max VC's for initiating RLE heel strike with gait to improve gait mechanics with poor to fair carryover throughout 340' of gait. Pt returned to bed and all questions addressed and all goals met. PT d/c recs remains appropriate.     Follow Up Recommendations  Home health PT;Follow surgeon's recommendation for DC plan and follow-up therapies;Supervision - Intermittent     Equipment Recommendations  Rolling walker with 5" wheels    Recommendations for Other Services       Precautions / Restrictions Precautions Precautions: Knee Restrictions Weight Bearing Restrictions: No RLE Weight Bearing: Weight bearing as tolerated    Mobility  Bed Mobility Overal bed mobility: Needs Assistance Bed Mobility: Supine to Sit     Supine to sit: Supervision;HOB elevated Sit to supine: Supervision   General bed mobility comments: increased time, HOB slightly elevated, not need for physical assistance.    Transfers Overall transfer level: Needs assistance Equipment used: Rolling walker (2 wheeled) Transfers: Sit to/from Stand Sit to Stand: Supervision            Ambulation/Gait Ambulation/Gait assistance: Supervision Gait Distance (Feet): 340 Feet Assistive device: Rolling walker (2 wheeled) Gait Pattern/deviations: Step-to pattern;Decreased stance time - right;Decreased step length - left;Decreased  dorsiflexion - right Gait velocity: Continues to dispaly good gait speed   General Gait Details: Continues to need mod-max cuing to maintain RLE heels trike with gait with poor to fair carryover.   Stairs             Wheelchair Mobility    Modified Rankin (Stroke Patients Only)       Balance Overall balance assessment: Needs assistance Sitting-balance support: No upper extremity supported;Feet supported Sitting balance-Leahy Scale: Good     Standing balance support: Bilateral upper extremity supported Standing balance-Leahy Scale: Fair                              Cognition Arousal/Alertness: Awake/alert Behavior During Therapy: WFL for tasks assessed/performed Overall Cognitive Status: Within Functional Limits for tasks assessed                                        Exercises Total Joint Exercises Quad Sets: AROM;Strengthening;Supine;Right;10 reps Heel Slides: AROM;Right;10 reps;Seated Long Arc Quad: AAROM;Right;10 reps;Seated Marching in Standing: AROM;5 reps;Seated Other Exercises Other Exercises: Education on importance of frequent heel strike on RLE for terminal knee extension.    General Comments        Pertinent Vitals/Pain Pain Assessment: Faces Faces Pain Scale: Hurts a little bit Pain Location: Groin muscle Pain Descriptors / Indicators: Cramping Pain Intervention(s): Limited activity within patient's tolerance;Monitored during session;Repositioned    Home Living Family/patient expects to be discharged to:: Private residence Living Arrangements: Spouse/significant other;Parent Available Help at Discharge: Family Type of Home: [facility]  Access: Ramped entrance;Level entry   Home Layout: One level;Able to live on main level with bedroom/bathroom Home Equipment: Grab bars - toilet;Walker - 2 wheels;Bedside commode;Grab bars - tub/shower;Shower seat;Cane - single point;Toilet riser;Wheelchair - power;Wheelchair -  manual;Shower seat - built in      Prior Function Level of Independence: Independent          PT Goals (current goals can now be found in the care plan section) Acute Rehab PT Goals Patient Stated Goal: go home PT Goal Formulation: With patient/family Time For Goal Achievement: 10/23/20 Potential to Achieve Goals: Good    Frequency    BID      PT Plan      Co-evaluation              AM-PAC PT "6 Clicks" Mobility   Outcome Measure  Help needed turning from your back to your side while in a flat bed without using bedrails?: A Little Help needed moving from lying on your back to sitting on the side of a flat bed without using bedrails?: A Little Help needed moving to and from a bed to a chair (including a wheelchair)?: A Little Help needed standing up from a chair using your arms (e.g., wheelchair or bedside chair)?: A Little Help needed to walk in hospital room?: A Little Help needed climbing 3-5 steps with a railing? : A Little 6 Click Score: 18    End of Session Equipment Utilized During Treatment: Gait belt Activity Tolerance: Patient tolerated treatment well Patient left: in bed;with family/visitor present;with call bell/phone within reach Nurse Communication: Mobility status PT Visit Diagnosis: Unsteadiness on feet (R26.81);Other abnormalities of gait and mobility (R26.89);Muscle weakness (generalized) (M62.81);Difficulty in walking, not elsewhere classified (R26.2)     Time: 8485-9276 PT Time Calculation (min) (ACUTE ONLY): 16 min  Charges:  $Gait Training: 8-22 mins                     Salem Caster. Fairly IV, PT, DPT Physical Therapist- [facility]  10/12/2020, 12:23 PM

## 2020-10-12 NOTE — Progress Notes (Signed)
  Subjective: 3 Days Post-Op Procedure(s) (LRB): TOTAL KNEE ARTHROPLASTY - Rachelle Hora to Assist (Right) Patient reports pain as well-controlled.   Patient is well without any acute complaints this AM. Plan is to go Home after hospital stay. Negative for chest pain and shortness of breath Fever: no Gastrointestinal: negative for nausea and vomiting.   Patient has had a BM  Objective: Vital signs in last 24 hours: Temp:  [97.8 F (36.6 C)-98.5 F (36.9 C)] 98.2 F (36.8 C) (07/15 0742) Pulse Rate:  [79-110] 83 (07/15 0742) Resp:  [16-19] 19 (07/15 0742) BP: (110-139)/(63-74) 127/72 (07/15 0742) SpO2:  [94 %-99 %] 98 % (07/15 0742)  Intake/Output from previous day:  Intake/Output Summary (Last 24 hours) at 10/12/2020 0800 Last data filed at 10/11/2020 1841 Gross per 24 hour  Intake 840 ml  Output --  Net 840 ml    Intake/Output this shift: No intake/output data recorded.  Labs: Recent Labs    10/09/20 1157 10/10/20 0455  HGB 12.6* 11.3*   Recent Labs    10/09/20 1157 10/10/20 0455  WBC 8.0 9.4  RBC 4.23 3.68*  HCT 37.5* 32.9*  PLT 174 168   Recent Labs    10/09/20 1157 10/10/20 0455  NA  --  134*  K  --  3.7  CL  --  96*  CO2  --  28  BUN  --  12  CREATININE 0.73 0.76  GLUCOSE  --  214*  CALCIUM  --  8.1*   No results for input(s): LABPT, INR in the last 72 hours.   EXAM General - Patient is Alert, Appropriate, and Oriented Extremity - Dorsiflexion/Plantar flexion intact Compartment soft Sensation intact to light touch over the saphenous and lateral sural cutaneous, decreased over the superficial fibular and deep fibular distributions  Dressing/Incision -Prevena in place and working, no drainage noted Motor Function - intact, moving foot and toes well on exam. Able to perform SLR with assistance.   Cardiovascular- Regular rate and rhythm, no murmurs/rubs/gallops Respiratory- Lungs clear to auscultation bilaterally Gastrointestinal- soft,  nontender, and active bowel sounds   Assessment/Plan: 3 Days Post-Op Procedure(s) (LRB): TOTAL KNEE ARTHROPLASTY - Rachelle Hora to Assist (Right) Active Problems:   S/P TKR (total knee replacement) using cement, right  Estimated body mass index is 29.43 kg/m as calculated from the following:   Height as of this encounter: 6\' 2"  (1.88 m).   Weight as of this encounter: 104 kg. Advance diet Up with therapy Discharge home with home health  Patient has had a BM and completed all goals with PT. Prevena intact to the right knee. Plan for discharge home today.  DVT Prophylaxis - Lovenox, Ted hose, and foot pumps Weight-Bearing as tolerated to right leg  J. Cameron Proud, PA-C [facility] Orthopaedic Surgery 10/12/2020, 8:00 AM

## 2020-10-12 NOTE — Progress Notes (Signed)
Discharge Note: Reviewed discharge instructions with pt.  Pt verbalized understanding. Pt discharge with personal walker from home, personal belongings, Polar care, bone foam, and also portable Prevena wound vac. Staff wheeled pt out. Pt transported to home by family vehicle.

## 2020-10-26 ENCOUNTER — Ambulatory Visit: Payer: Medicare HMO

## 2020-11-28 ENCOUNTER — Other Ambulatory Visit: Payer: Self-pay

## 2020-11-28 ENCOUNTER — Ambulatory Visit
Admission: RE | Admit: 2020-11-28 | Discharge: 2020-11-28 | Disposition: A | Discharge status: Home / Self Care | Payer: Medicare HMO | Source: Ambulatory Visit | Attending: Family Medicine | Admitting: Family Medicine

## 2020-11-28 DIAGNOSIS — Z87891 Personal history of nicotine dependence: Secondary | ICD-10-CM

## 2020-11-28 DIAGNOSIS — E782 Mixed hyperlipidemia: Secondary | ICD-10-CM | POA: Diagnosis not present

## 2020-11-28 DIAGNOSIS — Z136 Encounter for screening for cardiovascular disorders: Secondary | ICD-10-CM | POA: Diagnosis present

## 2021-02-15 ENCOUNTER — Other Ambulatory Visit: Payer: Self-pay

## 2021-02-15 ENCOUNTER — Encounter (INDEPENDENT_AMBULATORY_CARE_PROVIDER_SITE_OTHER): Payer: Medicare HMO | Admitting: Ophthalmology

## 2021-02-15 DIAGNOSIS — H43813 Vitreous degeneration, bilateral: Secondary | ICD-10-CM

## 2021-02-15 DIAGNOSIS — H35373 Puckering of macula, bilateral: Secondary | ICD-10-CM

## 2021-02-15 DIAGNOSIS — I10 Essential (primary) hypertension: Secondary | ICD-10-CM

## 2021-02-15 DIAGNOSIS — H35033 Hypertensive retinopathy, bilateral: Secondary | ICD-10-CM | POA: Diagnosis not present

## 2021-02-15 DIAGNOSIS — E113593 Type 2 diabetes mellitus with proliferative diabetic retinopathy without macular edema, bilateral: Secondary | ICD-10-CM

## 2021-08-15 ENCOUNTER — Encounter (INDEPENDENT_AMBULATORY_CARE_PROVIDER_SITE_OTHER): Payer: Medicare HMO | Admitting: Ophthalmology

## 2021-08-15 DIAGNOSIS — H43813 Vitreous degeneration, bilateral: Secondary | ICD-10-CM

## 2021-08-15 DIAGNOSIS — E113592 Type 2 diabetes mellitus with proliferative diabetic retinopathy without macular edema, left eye: Secondary | ICD-10-CM | POA: Diagnosis not present

## 2021-08-15 DIAGNOSIS — H35373 Puckering of macula, bilateral: Secondary | ICD-10-CM

## 2021-08-15 DIAGNOSIS — H35033 Hypertensive retinopathy, bilateral: Secondary | ICD-10-CM

## 2021-08-15 DIAGNOSIS — E113511 Type 2 diabetes mellitus with proliferative diabetic retinopathy with macular edema, right eye: Secondary | ICD-10-CM

## 2021-08-15 DIAGNOSIS — I10 Essential (primary) hypertension: Secondary | ICD-10-CM

## 2022-02-13 ENCOUNTER — Encounter (INDEPENDENT_AMBULATORY_CARE_PROVIDER_SITE_OTHER): Payer: Medicare HMO | Admitting: Ophthalmology

## 2022-02-13 DIAGNOSIS — I10 Essential (primary) hypertension: Secondary | ICD-10-CM | POA: Diagnosis not present

## 2022-02-13 DIAGNOSIS — H35033 Hypertensive retinopathy, bilateral: Secondary | ICD-10-CM | POA: Diagnosis not present

## 2022-02-13 DIAGNOSIS — H43813 Vitreous degeneration, bilateral: Secondary | ICD-10-CM

## 2022-02-13 DIAGNOSIS — H35373 Puckering of macula, bilateral: Secondary | ICD-10-CM

## 2022-02-13 DIAGNOSIS — E113592 Type 2 diabetes mellitus with proliferative diabetic retinopathy without macular edema, left eye: Secondary | ICD-10-CM | POA: Diagnosis not present

## 2022-02-13 DIAGNOSIS — E113511 Type 2 diabetes mellitus with proliferative diabetic retinopathy with macular edema, right eye: Secondary | ICD-10-CM

## 2022-06-20 IMAGING — DX DG KNEE 1-2V*R*
2 series · 2 of 2 positions shown · non-contrast
Comparison: None.

CLINICAL DATA: Right total knee arthroplasty.

EXAM:
RIGHT KNEE - 1-2 VIEW

[knee ap]
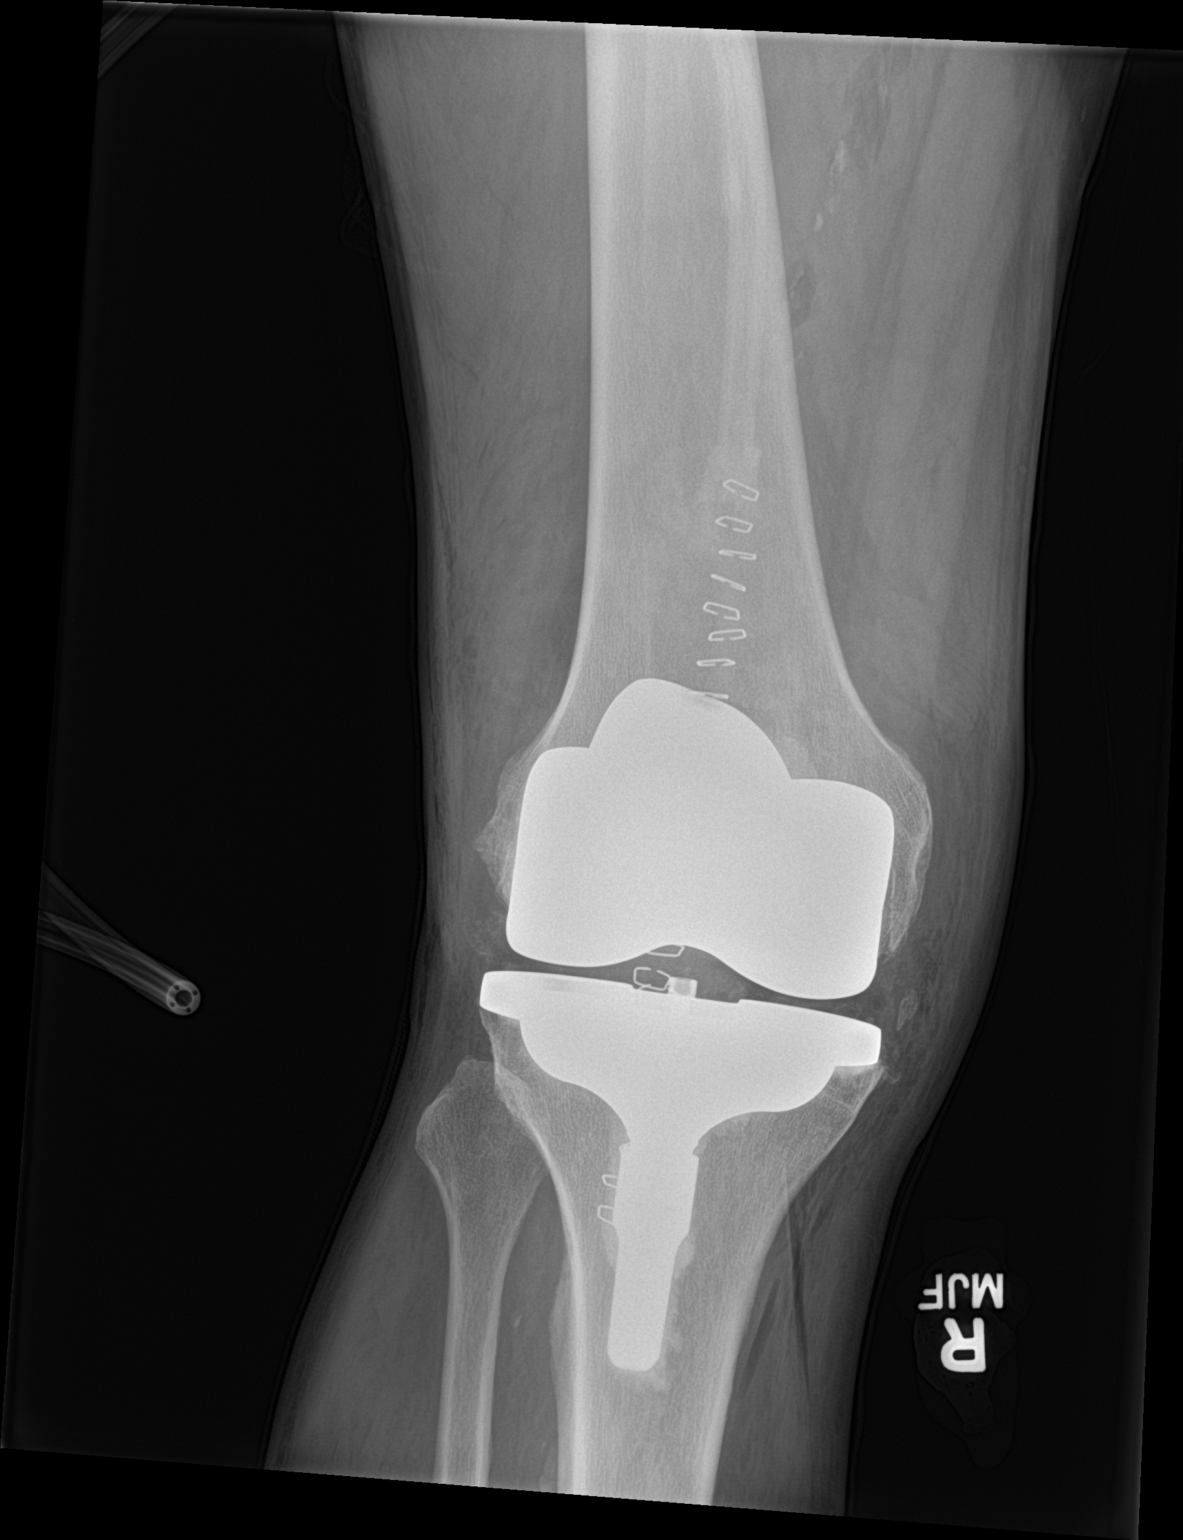

[knee lat]
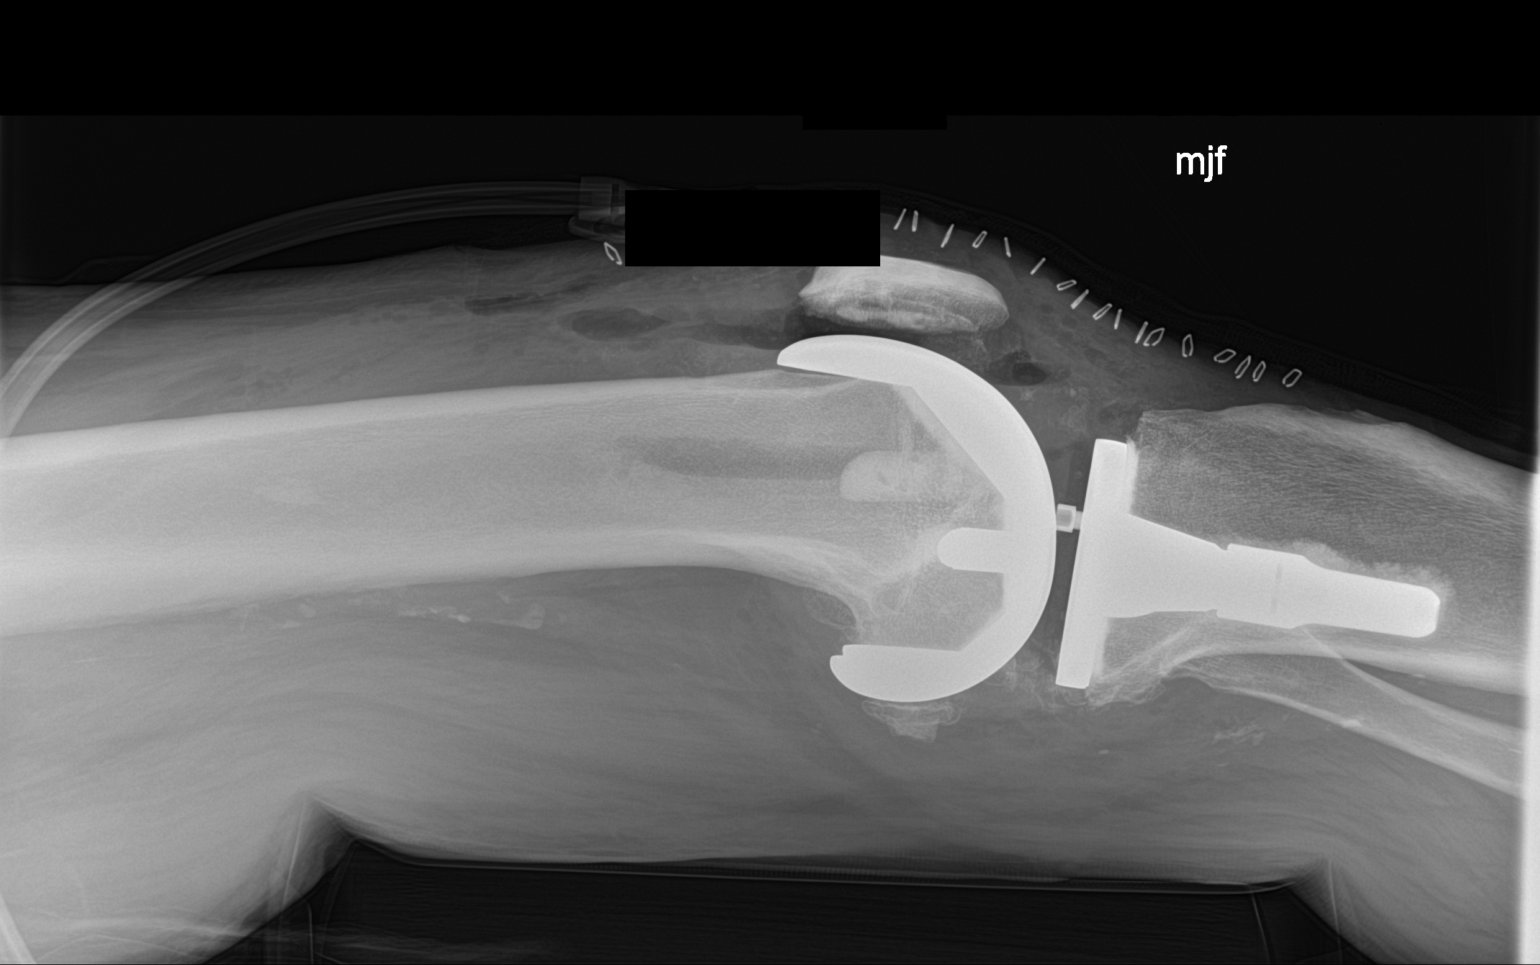

[2 of 2 positions shown; findings below may reference images not displayed]

FINDINGS: The components are well seated. No complicating features are
identified.
IMPRESSION: Well seated components of a total right knee arthroplasty.

## 2022-08-14 ENCOUNTER — Encounter (INDEPENDENT_AMBULATORY_CARE_PROVIDER_SITE_OTHER): Payer: Medicare HMO | Admitting: Ophthalmology

## 2022-08-14 DIAGNOSIS — H35033 Hypertensive retinopathy, bilateral: Secondary | ICD-10-CM | POA: Diagnosis not present

## 2022-08-14 DIAGNOSIS — H35373 Puckering of macula, bilateral: Secondary | ICD-10-CM

## 2022-08-14 DIAGNOSIS — H43813 Vitreous degeneration, bilateral: Secondary | ICD-10-CM

## 2022-08-14 DIAGNOSIS — I10 Essential (primary) hypertension: Secondary | ICD-10-CM

## 2022-08-14 DIAGNOSIS — Z7984 Long term (current) use of oral hypoglycemic drugs: Secondary | ICD-10-CM

## 2022-08-14 DIAGNOSIS — E113593 Type 2 diabetes mellitus with proliferative diabetic retinopathy without macular edema, bilateral: Secondary | ICD-10-CM

## 2022-08-14 DIAGNOSIS — Z794 Long term (current) use of insulin: Secondary | ICD-10-CM

## 2022-12-16 ENCOUNTER — Ambulatory Visit (INDEPENDENT_AMBULATORY_CARE_PROVIDER_SITE_OTHER): Payer: Medicare HMO | Admitting: Clinical

## 2022-12-16 DIAGNOSIS — F419 Anxiety disorder, unspecified: Secondary | ICD-10-CM | POA: Diagnosis not present

## 2022-12-16 NOTE — Progress Notes (Signed)
Dillon Barthel, LCSW

## 2022-12-16 NOTE — Progress Notes (Signed)
[facility] Counselor Initial Adult Exam  Name: Dillon Henry Date: 12/16/2022 MRN: [id number] DOB: [birth year] PCP: Marisue Ivan, MD  Time spent: 8:34am - 9:31am   Guardian/Payee:  NA    Paperwork requested:  NA  Reason for Visit /Presenting Problem: Patient reported for over a year patient has noticed changes in his mood and stated, "I would go to bed at night dreading tomorrow, this feeling of unsettledness and dread". Patient stated, "now it seems like stuff bothers me".   Mental Status Exam: Appearance:   Neat and Well Groomed     Behavior:  Appropriate  Motor:  Normal  Speech/Language:   Clear and Coherent  Affect:  Appropriate  Mood:  normal Patient stated, "I'm ok today".   Thought process:  normal  Thought content:    WNL  Sensory/Perceptual disturbances:    WNL  Orientation:  oriented to person, place, and situation  Attention:  Good  Concentration:  Good  Memory:  WNL  Fund of knowledge:   Good  Insight:    Good  Judgment:   Good  Impulse Control:  Good   Reported Symptoms:  Patient reported a decline in short term memory but reported he is able to recall information later. Patient reported feeling anxious and stated, "it wanes, it goes in and comes out" in reference to feeling anxious. Patient reported he thinks about tasks to be completed prior to going to sleep. Patient reported at the time he scheduled today's appointment patient was experiencing increased anxiety and stated, "it had been stronger". Patient reported symptoms of anxiety developed gradually and started within the last 2 years. Patient reported symptoms of anxiety increased over the summer.   Risk Assessment: Danger to Self:  No Patient denied current and past suicidal ideation and symptoms of psychosis.  Self-injurious Behavior: No Danger to Others: No Patient denied current and past homicidal ideation Duty to Warn:no Physical Aggression / Violence:No  Access to Firearms  a concern: No current concern but reported he has access to firearms Gang Involvement:No  Patient / guardian was educated about steps to take if suicide or homicide risk level increases between visits: yes While future psychiatric events cannot be accurately predicted, the patient does not currently require acute inpatient psychiatric care and does not currently meet [facility] involuntary commitment criteria.  Substance Abuse History: Current substance abuse: No  Patient reported no current use. Patient reported no alcohol use since 1978 and reported he discontinued tobacco use prior to 1978.   Past Psychiatric History:   No previous psychological problems have been observed Outpatient Providers: none History of Psych Hospitalization: No  Psychological Testing:  none    Abuse History:  Victim of: No.,  NA    Report needed: No. Victim of Neglect:No. Perpetrator of  none   Witness / Exposure to Domestic Violence: No   Protective Services Involvement: No  Witness to [employer] Violence:  No   Family History:  Family History  Problem Relation Age of Onset   Cancer Father        Leukemia   Stroke Father    Cancer Sister        Colon   Heart disease Other        Multiple MI's   Stroke Other        90's   Heart disease Other    Diabetes Other    Diabetes Maternal Grandmother    Depression Neg Hx    Alcohol abuse Neg Hx  Drug abuse Neg Hx     Living situation: the patient lives with their family (patient's wife and patient's mother)  Sexual Orientation: Straight  Relationship Status: married  Name of spouse / other: Dillon Henry If a parent, number of children / ages: 3 daughters (ages 64, 80, 38), 1 son died at age 41  Support Systems: spouse  Surveyor, quantity Stress:  No   Income/Employment/Disability: retired  Financial planner: No   Educational History: Education: some college  Religion/Sprituality/World View: Christian  Any cultural differences that may affect /  interfere with treatment:  not applicable   Recreation/Hobbies: music, being outside, 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 History of legal issue / charges:  NA  Medical History/Surgical History: reviewed Past Medical History:  Diagnosis Date   Arthritis    osteoarthritis   Charcot's arthropathy    Diabetes mellitus 1994   Type II   Diabetic retinopathy (HCC)    Dysrhythmia    AF, history of an ablation in 2005   Hammer toe of left foot    Hyperlipidemia 05/1997   Hypertension 1995   Neuromuscular disorder (HCC)    polyneuropathies d/t diabetes   Osteomyelitis (HCC) 01/2020   left great toe   Skin ulcer (HCC) 01/2020   full thickness ulceration over IPJ.   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    Past Surgical History:  Procedure Laterality Date   AMPUTATION TOE Left 02/17/2020   Procedure: AMPUTATION TOE MPJ LEFT;  Surgeon: Linus Galas, DPM;  Location: [facility] ORS;  Service: Podiatry;  Laterality: Left;   ATRIAL ABLATION SURGERY  2005   For A Fib   COLONOSCOPY WITH PROPOFOL N/A 12/23/2016   Procedure: COLONOSCOPY WITH PROPOFOL;  Surgeon: Christena Deem, MD;  Location: [facility] ENDOSCOPY;  Service: Endoscopy;  Laterality: N/A;   DOBUTAMINE STRESS ECHO  11/26/2005   Nonsust VTach HTSVE ECG NML   DOPPLER ECHOCARDIOGRAPHY  1999   Normal, EF 60%   DOPPLER ECHOCARDIOGRAPHY  01/11/2001   Mild CMC, LVH, TR MR, TR: ([place])   ETT  06/1998   Normal   Exercise tolerance test  05/1998   OK   Stress cardilite  12/1997   Normal   Stress cardiolite  03/02/2002   WNL  EF 70%   TOE AMPUTATION     TONSILLECTOMY AND ADENOIDECTOMY     TOTAL KNEE ARTHROPLASTY Right  10/09/2020   Procedure: TOTAL KNEE ARTHROPLASTY - [place] Neighbor to Assist;  Surgeon: Kennedy Bucker, MD;  Location: [facility] ORS;  Service: Orthopedics;  Laterality: Right;   VASECTOMY      Medications: Current Outpatient Medications  Medication Sig Dispense Refill   acetaminophen (TYLENOL) 500 MG tablet Take 1,000 mg by mouth every 8 (eight) hours.     amLODipine (NORVASC) 5 MG tablet Take 5 mg by mouth at bedtime.      aspirin EC 81 MG tablet Take 81 mg by mouth daily.     enoxaparin (LOVENOX) 40 MG/0.4ML injection Inject 0.4 mLs (40 mg total) into the skin daily for 14 days. 5.6 mL 0   gabapentin (NEURONTIN) 300 MG capsule Take 300 mg by mouth at bedtime.     glipiZIDE (GLUCOTROL) 10  MG tablet Take 10 mg by mouth daily before breakfast.      hydrochlorothiazide (HYDRODIURIL) 25 MG tablet Take 25 mg by mouth daily.     HYDROcodone-acetaminophen (NORCO/VICODIN) 5-325 MG tablet Take 1-2 tablets by mouth every 4 (four) hours as needed for moderate pain (pain score 4-6). 30 tablet 0   insulin NPH Human (NOVOLIN N) 100 UNIT/ML injection Inject 15 Units into the skin 2 (two) times daily.     insulin regular (NOVOLIN R,HUMULIN R) 100 units/mL injection Inject 5-12 Units into the skin 3 (three) times daily as needed for high blood sugar.     losartan (COZAAR) 50 MG tablet Take 50 mg by mouth every morning.      magnesium oxide (MAG-OX) 400 MG tablet Take 400 mg by mouth every evening.      metFORMIN (GLUCOPHAGE) 1000 MG tablet Take 1,000 mg by mouth 2 (two) times daily with a meal.      Polyvinyl Alcohol (LIQUID TEARS OP) Place 1 drop into both eyes daily as needed (dryness/irritation).     pravastatin (PRAVACHOL) 20 MG tablet Take 20 mg by mouth at bedtime.  3   zinc gluconate 50 MG tablet Take 50 mg by mouth daily.     No current facility-administered medications for this visit.    Allergies  Allergen Reactions   Lisinopril Cough   Pioglitazone Other (See Comments)    TOOTH PAIN   Rosiglitazone  Other (See Comments)    WEIGHT GAIN    Diagnoses:  Anxiety disorder, unspecified type  Plan of Care: Patient is a 72 year old male who presented for an initial assessment. Clinician conducted initial assessment in person from clinician's office at [facility]. Patient reported the following symptoms: decline in short term memory but reported he is able to recall the information later, feeling anxious, and thoughts about tasks to be completed prior to going to sleep. Patient reported symptoms of anxiety developed gradually and started within the last 2 years. Patient reported feelings of anxiety increased over the summer. Patient denied current and past suicidal ideation, homicidal ideation, and symptoms of psychosis. Patient reported no current substance use. Patient reported no history of inpatient or outpatient psychiatric treatment. Patient reported the fear of aging is a current stressor. Patient identified patient's wife as a current support. It is recommended patient participate in individual therapy biweekly. During session, clinician reviewed recommendation for therapy and patient reported he is open to participation in therapy. Treatment plan will be developed during follow up appointment.   Collaboration of Care: Primary Care Provider AEB Patient requested to complete a consent for patient's PCP, Dr. Marisue Ivan  Patient/Guardian was advised Release of Information must be obtained prior to any record release in order to collaborate their care with an outside provider.   Doree Barthel, LCSW

## 2022-12-23 ENCOUNTER — Ambulatory Visit: Payer: Medicare HMO | Admitting: Clinical

## 2022-12-23 DIAGNOSIS — F419 Anxiety disorder, unspecified: Secondary | ICD-10-CM | POA: Diagnosis not present

## 2022-12-23 NOTE — Progress Notes (Signed)
[facility] Counselor/Therapist Progress Note  Patient ID: Dillon Henry, MRN: [id number]    Date: 12/23/22  Time Spent: 8:37  am - 9:37 am : 60 Minutes  Treatment Type: Individual Therapy.  Reported Symptoms: Patient reported feeling anxious at times.   Mental Status Exam: Appearance:  Neat and Well Groomed     Behavior: Appropriate  Motor: Normal  Speech/Language:  Clear and Coherent  Affect: Appropriate  Mood: normal  Thought process: normal  Thought content:   WNL  Sensory/Perceptual disturbances:   WNL  Orientation: oriented to person, place, and situation  Attention: Good  Concentration: Good  Memory: WNL  Fund of knowledge:  Good  Insight:   Good  Judgment:  Good  Impulse Control: Good   Risk Assessment: Danger to Self:  No Patient denied current suicidal ideation  Self-injurious Behavior: No Danger to Others: No Patient denied current homicidal ideation Duty to Warn:no Physical Aggression / Violence:No  Access to Firearms a concern: No  Gang Involvement:No   Subjective:  Patient reported his 5 year old mother lives with patient/patient's wife. Patient reported patient's mother was independent with her ADL's until 8 days ago. Patient reported he scheduled a follow up appointment for his mother with a neurologist. Patient reported his mother exhibits confusion at times. Patient stated, "little things that shouldn't bother me do bother me". Patient reported a history of internalizing his feelings.  Patient reported difficulty recalling words at times. Patient stated, "I'm over focused on details than I use to be". Patient stated, "I do feel better now" in response to symptoms of anxiety. Patient reported his son died last year by suicide. Patient reported his son was patient's best friend and patient reported he spoke with his son frequently prior to son's death. Patient stated, "I'm fully at peace with that" in response to his son's death. Patient  reported he is open to participation in therapy. Patient stated, "feeling pretty good" in response to patient's mood today.   Interventions: Clinician conducted session in person at clinician's office at [facility]. Provided psycho education related to decline in cognitive functioning and the assessment process for changes in cognitive functioning. Provided psycho education related to normal age related changes as it relates to cognitive functioning. Clinician reviewed diagnosis and treatment recommendations. Provided psycho education related to diagnosis and treatment. Assessed patient's current mood.   Collaboration of Care: Other not required at this time  Diagnosis:  Anxiety disorder, unspecified type   Plan: Goals to be determined during follow up appointment on 01/20/23.             Doree Barthel, LCSW

## 2023-01-20 ENCOUNTER — Ambulatory Visit: Payer: Medicare HMO | Admitting: Clinical

## 2023-01-20 DIAGNOSIS — F419 Anxiety disorder, unspecified: Secondary | ICD-10-CM

## 2023-01-20 NOTE — Progress Notes (Signed)
[facility] Counselor/Therapist Progress Note  Patient ID: Dillon Henry, MRN: [id number]    Date: 01/20/23  Time Spent: 9:33  am - 10:37 am : 64 Minutes  Treatment Type: Individual Therapy.  Reported Symptoms: Patient reported feelings of dread and worry at times  Mental Status Exam: Appearance:  Neat and Well Groomed     Behavior: Appropriate  Motor: Normal  Speech/Language:  Clear and Coherent  Affect: Appropriate  Mood: normal  Thought process: normal  Thought content:   WNL  Sensory/Perceptual disturbances:   WNL  Orientation: oriented to person, place, and situation  Attention: Good  Concentration: Good  Memory: WNL  Fund of knowledge:  Good  Insight:   Good  Judgment:  Good  Impulse Control: Good   Risk Assessment: Danger to Self:  No Patient denied current suicidal ideation  Self-injurious Behavior: No Danger to Others: No Patient denied current homicidal ideation Duty to Warn:no Physical Aggression / Violence:No  Access to Firearms a concern: No  Gang Involvement:No   Subjective:  Patient stated, "good" in response to event since last session. Patient stated, "its a roller coaster", "good times and bad times" in response to patient's mood since last session. Patient reported he has noted a difference in his mood when he is able to spend time outside.  Patient reported he experiences more "good times than bad times" since last session. Patient reported over the past couple of weeks he has not been able to spend as much time outside due to his mother having multiple appointments. Patient reported he feels his mother is experiencing boredom and stated, "I see this creeping into my life". Patient stated, "minimize anxiety", "restore joy and overall outlook on life", "dealing with my wife's minutiae in a constructive non-offensive way" in response to potential goals for therapy. Patient stated, "I don't want to get to a point where I feel like I don't  have any autonomy". Patient reported concerned about the unknown. Patient stated, "I want to be satisfied with life".   Interventions: Motivational Interviewing. Clinician conducted session in person at clinician's office at [facility]. Reviewed events since last  session. Assessed patient's mood. Clinician utilized motivational interviewing to explore potential goals for therapy. Clinician utilized a task centered approach in collaboration with patient to develop goals for therapy. Patient participated in development of goals and agreed to goals for therapy. Provided psycho education related to completion of a thought record and cognitive distortions. Clinician requested patient complete a thought record for homework.   Collaboration of Care: Other not required at this time  Diagnosis:  Anxiety disorder, unspecified type   Plan: Patient is to utilize Cognitive Behavioral Therapy, thought re-framing, mindfulness and coping strategies to decrease symptoms associated with their diagnosis. Frequency: bi-weekly  Modality: individual     Long-term goal:   Reduce overall level, frequency, and intensity of the feelings of anxiety as evidenced by decrease in "feelings of dread", worry, and feeling unsettled from 4 to 5 times/week to 0 to 1 times/week per patient's report for at least 3 consecutive months. Target Date: 01/20/24  Progress: 0   Short-term goal:  Develop an understanding of the relationship between feelings of anxiety and their impact on thinking patterns and behaviors. Target Date: 07/21/23  Progress: 0   Patient stated, "to be satisfied with life" and verbalize hopeful and positive statements regarding patient's future Target Date: 07/21/23  Progress: 0  Doree Barthel, LCSW

## 2023-02-03 ENCOUNTER — Ambulatory Visit: Payer: Medicare HMO | Admitting: Clinical

## 2023-02-12 ENCOUNTER — Encounter (INDEPENDENT_AMBULATORY_CARE_PROVIDER_SITE_OTHER): Payer: Medicare HMO | Admitting: Ophthalmology

## 2023-02-12 DIAGNOSIS — H43813 Vitreous degeneration, bilateral: Secondary | ICD-10-CM

## 2023-02-12 DIAGNOSIS — H35033 Hypertensive retinopathy, bilateral: Secondary | ICD-10-CM

## 2023-02-12 DIAGNOSIS — Z794 Long term (current) use of insulin: Secondary | ICD-10-CM | POA: Diagnosis not present

## 2023-02-12 DIAGNOSIS — E113593 Type 2 diabetes mellitus with proliferative diabetic retinopathy without macular edema, bilateral: Secondary | ICD-10-CM

## 2023-02-12 DIAGNOSIS — I10 Essential (primary) hypertension: Secondary | ICD-10-CM | POA: Diagnosis not present

## 2023-02-12 DIAGNOSIS — Z7984 Long term (current) use of oral hypoglycemic drugs: Secondary | ICD-10-CM | POA: Diagnosis not present

## 2023-02-12 DIAGNOSIS — H35371 Puckering of macula, right eye: Secondary | ICD-10-CM

## 2023-02-17 ENCOUNTER — Encounter: Payer: Self-pay | Admitting: *Deleted

## 2023-03-03 ENCOUNTER — Ambulatory Visit: Payer: Medicare HMO | Admitting: Clinical

## 2023-03-03 DIAGNOSIS — F419 Anxiety disorder, unspecified: Secondary | ICD-10-CM | POA: Diagnosis not present

## 2023-03-03 NOTE — Progress Notes (Signed)
                Dezi Schaner, LCSW 

## 2023-03-03 NOTE — Progress Notes (Addendum)
[facility] Counselor/Therapist Progress Note  Patient ID: Dillon Henry, MRN: [id number],    Date: 03/03/2023  Time Spent: 1:32pm - 2:35pm : 63 minutes   Treatment Type: Individual Therapy  Reported Symptoms: Patient reported feelings of dread at times  Mental Status Exam: Appearance:  Neat and Well Groomed     Behavior: Appropriate  Motor: Normal  Speech/Language:  Clear and Coherent  Affect: Appropriate  Mood: normal  Thought process: normal  Thought content:   WNL  Sensory/Perceptual disturbances:   WNL  Orientation: oriented to person, place, and situation  Attention: Good  Concentration: Good  Memory: WNL  Fund of knowledge:  Good  Insight:   Good  Judgment:  Good  Impulse Control: Good   Risk Assessment: Danger to Self:  No Patient denied current suicidal ideation  Self-injurious Behavior: No Danger to Others: No Patient denied current homicidal ideation Duty to Warn:no Physical Aggression / Violence:No  Access to Firearms a concern: No  Gang Involvement:No   Subjective: Patient stated, "I think pretty good" in response to events since last session. Patient stated, "I don't know that the feelings have changed but my handling of them has been different". Patient stated, "a lot of this stuff if I think about it logically I can dispell it" and reported he is "dispelling" the thoughts more frequently. Patient stated, "feeling a little bit more confident". Patient reported he experienced thoughts of worst case scenario while traveling today. Patient stated, "I'm getting to where I can recognize that (cognitive distortions) and bring it back in line". Patient stated,  "where do these things come from I've never had them before" in reference to anxiety/thoughts. Patient reported no family history of mood disorders. Patient stated, "I don't have any stress in my life". Patient stated, "I don't have enough going on to grab my attention", "I don't have pressing  issues". Patient stated, "imagining there is something there that is not there" in response to triggers. Patient reported no entries in his thought record since last session. Patient reported "fear of not being able to do what I want to do or need to do", fear of being in a nursing home, and stated, "the fear is the unknown" in response to potential triggers for anxiety. Patient stated, "nagging feeling of losing control". During session, patient inquired about cognitive decline. Patient stated, "I've always had an above average memory".    Interventions: Cognitive Behavioral Therapy. Clinician conducted session in person at clinician's office at [facility]. Reviewed events since last session. Assessed intensity and frequency of anxiety.  Provided psycho education related to wise mind, cognitive distortions, and challenging distortions. Explored and identified thoughts associated with feelings of anxiety and strategies patient has implemented in response. Provided psycho education related to anxiety, mood disorders, and triggers for anxiety. Assisted patient in exploring triggers for anxiety. Reviewed patient's thought record. Provided psycho education related to cognitive decline and normal age related changes. Clinician requested patient continue thought record for homework.   Collaboration of Care: Other not required at this time   Diagnosis:  Anxiety disorder, unspecified type     Plan: Patient is to utilize Cognitive Behavioral Therapy, thought re-framing, mindfulness and coping strategies to decrease symptoms associated with their diagnosis. Frequency: bi-weekly  Modality: individual      Long-term goal:   Reduce overall level, frequency, and intensity of the feelings of anxiety as evidenced by decrease in "feelings of dread", worry, and feeling unsettled from 4 to 5 times/week to  0 to 1 times/week per patient's report for at least 3 consecutive months. Target Date: 01/20/24   Progress: progressing    Short-term goal:  Develop an understanding of the relationship between feelings of anxiety and their impact on thinking patterns and behaviors. Target Date: 07/21/23  Progress: progressing    Patient stated, "to be satisfied with life" and verbalize hopeful and positive statements regarding patient's future Target Date: 07/21/23  Progress: progressing                                  Doree Barthel, LCSW

## 2023-03-06 ENCOUNTER — Encounter: Payer: Self-pay | Admitting: *Deleted

## 2023-03-09 ENCOUNTER — Ambulatory Visit
Admission: RE | Admit: 2023-03-09 | Discharge: 2023-03-09 | Disposition: A | Discharge status: Home / Self Care | Payer: Medicare HMO | Attending: Gastroenterology | Admitting: Gastroenterology

## 2023-03-09 ENCOUNTER — Ambulatory Visit: Payer: Medicare HMO | Admitting: Certified Registered"

## 2023-03-09 ENCOUNTER — Other Ambulatory Visit: Payer: Self-pay

## 2023-03-09 ENCOUNTER — Encounter: Payer: Self-pay | Admitting: *Deleted

## 2023-03-09 ENCOUNTER — Encounter
Admission: RE | Disposition: A | Discharge status: Home / Self Care | Payer: Self-pay | Source: Home / Self Care | Attending: Gastroenterology

## 2023-03-09 DIAGNOSIS — D122 Benign neoplasm of ascending colon: Secondary | ICD-10-CM | POA: Diagnosis not present

## 2023-03-09 DIAGNOSIS — Z7984 Long term (current) use of oral hypoglycemic drugs: Secondary | ICD-10-CM | POA: Insufficient documentation

## 2023-03-09 DIAGNOSIS — E11319 Type 2 diabetes mellitus with unspecified diabetic retinopathy without macular edema: Secondary | ICD-10-CM | POA: Insufficient documentation

## 2023-03-09 DIAGNOSIS — E1142 Type 2 diabetes mellitus with diabetic polyneuropathy: Secondary | ICD-10-CM | POA: Insufficient documentation

## 2023-03-09 DIAGNOSIS — Z87891 Personal history of nicotine dependence: Secondary | ICD-10-CM | POA: Insufficient documentation

## 2023-03-09 DIAGNOSIS — Z1211 Encounter for screening for malignant neoplasm of colon: Secondary | ICD-10-CM | POA: Insufficient documentation

## 2023-03-09 DIAGNOSIS — K5730 Diverticulosis of large intestine without perforation or abscess without bleeding: Secondary | ICD-10-CM | POA: Diagnosis not present

## 2023-03-09 DIAGNOSIS — Z794 Long term (current) use of insulin: Secondary | ICD-10-CM | POA: Insufficient documentation

## 2023-03-09 DIAGNOSIS — Z800 Family history of malignant neoplasm of digestive organs: Secondary | ICD-10-CM | POA: Insufficient documentation

## 2023-03-09 DIAGNOSIS — I10 Essential (primary) hypertension: Secondary | ICD-10-CM | POA: Diagnosis not present

## 2023-03-09 DIAGNOSIS — Z79899 Other long term (current) drug therapy: Secondary | ICD-10-CM | POA: Diagnosis not present

## 2023-03-09 HISTORY — PX: POLYPECTOMY: SHX5525

## 2023-03-09 HISTORY — PX: COLONOSCOPY WITH PROPOFOL: SHX5780

## 2023-03-09 LAB — GLUCOSE, CAPILLARY: Glucose-Capillary: 149 mg/dL — ABNORMAL HIGH (ref 70–99)

## 2023-03-09 SURGERY — COLONOSCOPY WITH PROPOFOL
Anesthesia: General

## 2023-03-09 MED ORDER — PROPOFOL 500 MG/50ML IV EMUL
INTRAVENOUS | Status: DC | PRN
  Administered 2023-03-09: 125 ug/kg/min via INTRAVENOUS

## 2023-03-09 MED ORDER — LIDOCAINE HCL (CARDIAC) PF 100 MG/5ML IV SOSY
PREFILLED_SYRINGE | INTRAVENOUS | Status: DC | PRN
  Administered 2023-03-09: 80 mg via INTRAVENOUS

## 2023-03-09 MED ORDER — PROPOFOL 10 MG/ML IV BOLUS
INTRAVENOUS | Status: DC | PRN
  Administered 2023-03-09: 20 mg via INTRAVENOUS
  Administered 2023-03-09: 80 mg via INTRAVENOUS

## 2023-03-09 MED ORDER — SODIUM CHLORIDE 0.9 % IV SOLN: INTRAVENOUS | Status: DC

## 2023-03-09 MED ORDER — STERILE WATER FOR IRRIGATION IR SOLN
Status: DC | PRN
  Administered 2023-03-09: 60 mL

## 2023-03-09 NOTE — Interval H&P Note (Signed)
History and Physical Interval Note:  03/09/2023 12:57 PM  Dillon Henry  has presented today for surgery, with the diagnosis of hx ofd adenomatois polyp of colon.  The various methods of treatment have been discussed with the patient and family. After consideration of risks, benefits and other options for treatment, the patient has consented to  Procedure(s): COLONOSCOPY WITH PROPOFOL (N/A) as a surgical intervention.  The patient's history has been reviewed, patient examined, no change in status, stable for surgery.  I have reviewed the patient's chart and labs.  Questions were answered to the patient's satisfaction.     Regis Bill  Ok to proceed with colonoscopy

## 2023-03-09 NOTE — Anesthesia Postprocedure Evaluation (Signed)
Anesthesia Post Note  Patient: Dillon Henry  Procedure(s) Performed: COLONOSCOPY WITH PROPOFOL  Patient location during evaluation: Endoscopy Anesthesia Type: General Level of consciousness: awake and alert Pain management: pain level controlled Vital Signs Assessment: post-procedure vital signs reviewed and stable Respiratory status: spontaneous breathing, nonlabored ventilation, respiratory function stable and patient connected to nasal cannula oxygen Cardiovascular status: blood pressure returned to baseline and stable Postop Assessment: no apparent nausea or vomiting Anesthetic complications: no   No notable events documented.   Last Vitals:  Vitals:   03/09/23 1228 03/09/23 1329  BP: (!) 156/82 120/65  Pulse: 79   Resp: 18   Temp: (!) 36.3 C 36.8 C  SpO2: 97%     Last Pain:  Vitals:   03/09/23 1329  TempSrc: Temporal  PainSc: Asleep                 Louie Boston

## 2023-03-09 NOTE — Op Note (Signed)
[facility] Gastroenterology Patient Name: Dillon Henry Procedure Date: 03/09/2023 12:53 PM MRN: [id number] Account #: [id number] Date of Birth: Aug 21, 1950 Admit Type: Outpatient Age: 72 Room: [facility] ENDO ROOM 3 Gender: Male Note Status: Finalized Instrument Name: Nelda Marseille [id number] Procedure:             Colonoscopy Indications:           Surveillance: Personal history of adenomatous polyps                         on last colonoscopy 5 years ago, Family history of                         colon cancer in a first-degree relative before age 42                         years Providers:             Eather Colas MD, MD Referring MD:          Marisue Ivan (Referring MD) Medicines:             Monitored Anesthesia Care Complications:         No immediate complications. Estimated blood loss:                         Minimal. Procedure:             Pre-Anesthesia Assessment:                        - Prior to the procedure, a History and Physical was                         performed, and patient medications and allergies were                         reviewed. The patient is competent. The risks and                         benefits of the procedure and the sedation options and                         risks were discussed with the patient. All questions                         were answered and informed consent was obtained.                         Patient identification and proposed procedure were                         verified by the physician, the nurse, the                         anesthesiologist, the anesthetist and the technician                         in the endoscopy suite. Mental Status Examination:  alert and oriented. Airway Examination: normal                         oropharyngeal airway and neck mobility. Respiratory                         Examination: clear to auscultation. CV Examination:                         normal.  Prophylactic Antibiotics: The patient does not                         require prophylactic antibiotics. Prior                         Anticoagulants: The patient has taken no anticoagulant                         or antiplatelet agents. ASA Grade Assessment: III - A                         patient with severe systemic disease. After reviewing                         the risks and benefits, the patient was deemed in                         satisfactory condition to undergo the procedure. The                         anesthesia plan was to use monitored anesthesia care                         (MAC). Immediately prior to administration of                         medications, the patient was re-assessed for adequacy                         to receive sedatives. The heart rate, respiratory                         rate, oxygen saturations, blood pressure, adequacy of                         pulmonary ventilation, and response to care were                         monitored throughout the procedure. The physical                         status of the patient was re-assessed after the                         procedure.                        After obtaining informed consent, the colonoscope was  passed under direct vision. Throughout the procedure,                         the patient's blood pressure, pulse, and oxygen                         saturations were monitored continuously. The                         Colonoscope was introduced through the anus and                         advanced to the the terminal ileum, with                         identification of the appendiceal orifice and IC                         valve. The colonoscopy was performed without                         difficulty. The patient tolerated the procedure well.                         The quality of the bowel preparation was good. The                         ileocecal valve, appendiceal orifice, and  rectum were                         photographed. Findings:      The perianal and digital rectal examinations were normal.      The terminal ileum appeared normal.      Two sessile polyps were found in the ascending colon. The polyps were 3       to 6 mm in size. These polyps were removed with a cold snare. Resection       and retrieval were complete. Estimated blood loss was minimal.      Multiple small-mouthed diverticula were found in the sigmoid colon,       descending colon and hepatic flexure.      The exam was otherwise without abnormality on direct and retroflexion       views. Impression:            - The examined portion of the ileum was normal.                        - Two 3 to 6 mm polyps in the ascending colon, removed                         with a cold snare. Resected and retrieved.                        - Diverticulosis in the sigmoid colon, in the                         descending colon and at the hepatic flexure.                        -  The examination was otherwise normal on direct and                         retroflexion views. Recommendation:        - Discharge patient to home.                        - Resume previous diet.                        - Continue present medications.                        - Await pathology results.                        - Repeat colonoscopy in 5 years for surveillance.                        - Return to referring physician as previously                         scheduled. Procedure Code(s):     --- Professional ---                        [phone], Colonoscopy, flexible; with removal of                         tumor(s), polyp(s), or other lesion(s) by snare                         technique Diagnosis Code(s):     --- Professional ---                        Z86.010, Personal history of colonic polyps                        D12.2, Benign neoplasm of ascending colon                        Z80.0, Family history of malignant neoplasm of                          digestive organs                        K57.30, Diverticulosis of large intestine without                         perforation or abscess without bleeding CPT copyright 2022 [employer]. All rights reserved. The codes documented in this report are preliminary and upon coder review may  be revised to meet current compliance requirements. Eather Colas MD, MD 03/09/2023 1:33:12 PM Number of Addenda: 0 Note Initiated On: 03/09/2023 12:53 PM Scope Withdrawal Time: 0 hours 9 minutes 47 seconds  Total Procedure Duration: 0 hours 14 minutes 54 seconds  Estimated Blood Loss:  Estimated blood loss was minimal.      [facility]

## 2023-03-09 NOTE — H&P (Signed)
Outpatient short stay form Pre-procedure 03/09/2023  Dillon Bill, MD  Primary Physician: Marisue Ivan, MD  Reason for visit:  Surveillance  History of present illness:    72 y/o gentleman with history of hypertension, DM II, and arthritis here for surveillance colonoscopy. No significant abdominal surgeries. No blood thinners. Sister with colon cancer in her 23's.    Current Facility-Administered Medications:    0.9 %  sodium chloride infusion, , Intravenous, Continuous, Alveta Quintela, Rossie Muskrat, MD, Last Rate: 40 mL/hr at 03/09/23 1236, New Bag at 03/09/23 1236  Medications Prior to Admission  Medication Sig Dispense Refill Last Dose   acetaminophen (TYLENOL) 500 MG tablet Take 1,000 mg by mouth every 8 (eight) hours.   03/08/2023   amLODipine (NORVASC) 5 MG tablet Take 5 mg by mouth at bedtime.    03/08/2023   aspirin EC 81 MG tablet Take 81 mg by mouth daily.   03/08/2023   gabapentin (NEURONTIN) 300 MG capsule Take 300 mg by mouth at bedtime.   03/08/2023   hydrochlorothiazide (HYDRODIURIL) 25 MG tablet Take 25 mg by mouth daily.   03/08/2023   HYDROcodone-acetaminophen (NORCO/VICODIN) 5-325 MG tablet Take 1-2 tablets by mouth every 4 (four) hours as needed for moderate pain (pain score 4-6). 30 tablet 0 03/08/2023   insulin NPH Human (NOVOLIN N) 100 UNIT/ML injection Inject 15 Units into the skin 2 (two) times daily.   03/08/2023   insulin regular (NOVOLIN R,HUMULIN R) 100 units/mL injection Inject 5-12 Units into the skin 3 (three) times daily as needed for high blood sugar.   03/09/2023 at 0800   losartan (COZAAR) 50 MG tablet Take 50 mg by mouth every morning.    03/08/2023   magnesium oxide (MAG-OX) 400 MG tablet Take 400 mg by mouth every evening.    03/08/2023   metFORMIN (GLUCOPHAGE) 1000 MG tablet Take 1,000 mg by mouth 2 (two) times daily with a meal.    03/08/2023   Polyvinyl Alcohol (LIQUID TEARS OP) Place 1 drop into both eyes daily as needed (dryness/irritation).    03/08/2023   pravastatin (PRAVACHOL) 20 MG tablet Take 20 mg by mouth at bedtime.  3 03/08/2023   zinc gluconate 50 MG tablet Take 50 mg by mouth daily.   03/08/2023   enoxaparin (LOVENOX) 40 MG/0.4ML injection Inject 0.4 mLs (40 mg total) into the skin daily for 14 days. 5.6 mL 0    glipiZIDE (GLUCOTROL) 10 MG tablet Take 10 mg by mouth daily before breakfast.         Allergies  Allergen Reactions   Lisinopril Cough   Pioglitazone Other (See Comments)    TOOTH PAIN   Rosiglitazone Other (See Comments)    WEIGHT GAIN     Past Medical History:  Diagnosis Date   Arthritis    osteoarthritis   Charcot's arthropathy    Diabetes mellitus 1994   Type II   Diabetic retinopathy (HCC)    Dysrhythmia    AF, history of an ablation in 2005   Hammer toe of left foot    Hyperlipidemia 05/1997   Hypertension 1995   Neuromuscular disorder (HCC)    polyneuropathies d/t diabetes   Osteomyelitis (HCC) 01/2020   left great toe   Skin ulcer (HCC) 01/2020   full thickness ulceration over IPJ.   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    Physical Exam  Gen: Alert, oriented. Appears stated age.  HEENT: PERRLA.  Lungs: No respiratory distress CV: RRR Abd: soft, benign, no masses Ext: No edema    Planned procedures: Proceed with colonoscopy. The patient understands the nature of the planned procedure, indications, risks, alternatives and potential complications including but not limited to bleeding, infection, perforation, damage to internal organs and possible oversedation/side effects from anesthesia. The patient agrees and gives consent to proceed.  Please refer to procedure notes for findings, recommendations and patient disposition/instructions.     Dillon Bill, MD [facility]

## 2023-03-09 NOTE — Anesthesia Preprocedure Evaluation (Addendum)
Anesthesia Evaluation  Patient identified by MRN, date of birth, ID band Patient awake    Reviewed: Allergy & Precautions, NPO status , Patient's Chart, lab work & pertinent test results  History of Anesthesia Complications Negative for: history of anesthetic complications  Airway Mallampati: III  TM Distance: >3 FB Neck ROM: full    Dental  (+) Chipped, Missing   Pulmonary former smoker   Pulmonary exam normal        Cardiovascular hypertension, On Medications + dysrhythmias (s/p ablation) Atrial Fibrillation      Neuro/Psych  Neuromuscular disease  negative psych ROS   GI/Hepatic negative GI ROS, Neg liver ROS,,,  Endo/Other  diabetes    Renal/GU negative Renal ROS  negative genitourinary   Musculoskeletal   Abdominal   Peds  Hematology negative hematology ROS (+)   Anesthesia Other Findings Past Medical History: No date: Arthritis     Comment:  osteoarthritis No date: Charcot's arthropathy 1994: Diabetes mellitus     Comment:  Type II No date: Diabetic retinopathy (HCC) No date: Dysrhythmia     Comment:  AF, history of an ablation in 2005 No date: Hammer toe of left foot 05/1997: Hyperlipidemia 1995: Hypertension No date: Neuromuscular disorder (HCC)     Comment:  polyneuropathies d/t diabetes 01/2020: Osteomyelitis (HCC)     Comment:  left great toe 01/2020: Skin ulcer (HCC)     Comment:  full thickness ulceration over IPJ. 01/2020: Tinnitus     Comment:  bilateral with sensory hearing loss  Past Surgical History: 02/17/2020: AMPUTATION TOE; Left     Comment:  Procedure: AMPUTATION TOE MPJ LEFT;  Surgeon: Linus Galas, DPM;  Location: [facility] ORS;  Service: Podiatry;                Laterality: Left; 2005: ATRIAL ABLATION SURGERY     Comment:  For A Fib 12/23/2016: COLONOSCOPY WITH PROPOFOL; N/A     Comment:  Procedure: COLONOSCOPY WITH PROPOFOL;  Surgeon:               Christena Deem, MD;  Location: [facility] ENDOSCOPY;                Service: Endoscopy;  Laterality: N/A; 11/26/2005: DOBUTAMINE STRESS ECHO     Comment:  Nonsust VTach HTSVE ECG NML 1999: DOPPLER ECHOCARDIOGRAPHY     Comment:  Normal, EF 60% 01/11/2001: DOPPLER ECHOCARDIOGRAPHY     Comment:  Mild CMC, LVH, TR MR, TR: ([place]) 06/1998: ETT     Comment:  Normal 05/1998: Exercise tolerance test     Comment:  OK 12/1997: Stress cardilite     Comment:  Normal 03/02/2002: Stress cardiolite     Comment:  WNL  EF 70% No date: TOE AMPUTATION No date: TONSILLECTOMY AND ADENOIDECTOMY 10/09/2020: TOTAL KNEE ARTHROPLASTY; Right     Comment:  Procedure: TOTAL KNEE ARTHROPLASTY - [place] to               Assist;  Surgeon: Kennedy Bucker, MD;  Location: [facility] ORS;              Service: Orthopedics;  Laterality: Right; No date: VASECTOMY  BMI    Body Mass Index: 28.76 kg/m      Reproductive/Obstetrics negative OB ROS  Anesthesia Physical Anesthesia Plan  ASA: 3  Anesthesia Plan: General   Post-op Pain Management: Minimal or no pain anticipated   Induction: Intravenous  PONV Risk Score and Plan: 1 and Propofol infusion and TIVA  Airway Management Planned: Natural Airway and Nasal Cannula  Additional Equipment:   Intra-op Plan:   Post-operative Plan:   Informed Consent: I have reviewed the patients History and Physical, chart, labs and discussed the procedure including the risks, benefits and alternatives for the proposed anesthesia with the patient or authorized representative who has indicated his/her understanding and acceptance.     Dental Advisory Given  Plan Discussed with: Anesthesiologist, CRNA and Surgeon  Anesthesia Plan Comments: (Patient consented for risks of anesthesia including but not limited to:  - adverse reactions to medications - risk of airway placement if required - damage to eyes, teeth, lips or other oral  mucosa - nerve damage due to positioning  - sore throat or hoarseness - Damage to heart, brain, nerves, lungs, other parts of body or loss of life  Patient voiced understanding and assent.)        Anesthesia Quick Evaluation

## 2023-03-09 NOTE — Transfer of Care (Addendum)
Immediate Anesthesia Transfer of Care Note  Patient: Dillon Henry  Procedure(s) Performed: COLONOSCOPY WITH PROPOFOL  Patient Location: PACU  Anesthesia Type:General  Level of Consciousness: sedated  Airway & Oxygen Therapy: Patient Spontanous Breathing  Post-op Assessment: Report given to RN and Post -op Vital signs reviewed and stable  Post vital signs: Reviewed and stable  Last Vitals:  Vitals Value Taken Time  BP 120/65 03/09/23 1330  Temp    Pulse 68 03/09/23 1328  Resp 18 03/09/23 1328  SpO2 97 % 03/09/23 1328  Vitals shown include unfiled device data.  Last Pain:  Vitals:   03/09/23 1228  TempSrc: Temporal  PainSc: 0-No pain         Complications: No notable events documented.

## 2023-03-10 ENCOUNTER — Encounter: Payer: Self-pay | Admitting: Gastroenterology

## 2023-03-10 LAB — SURGICAL PATHOLOGY

## 2023-03-19 ENCOUNTER — Ambulatory Visit: Payer: Medicare HMO | Admitting: Clinical

## 2023-03-19 DIAGNOSIS — F419 Anxiety disorder, unspecified: Secondary | ICD-10-CM | POA: Diagnosis not present

## 2023-03-19 NOTE — Progress Notes (Signed)
[facility] Counselor/Therapist Progress Note  Patient ID: Dillon Henry, Dillon Henry,    Date: 03/19/2023  Time Spent: 10:36am - 11:36am : 60 minutes   Treatment Type: Individual Therapy  Reported Symptoms: Patient reported a decrease in symptoms of anxiety  Mental Status Exam: Appearance:  Neat and Well Groomed     Behavior: Appropriate  Motor: Normal  Speech/Language:  Clear and Coherent  Affect: Appropriate  Mood: normal  Thought process: normal  Thought content:   WNL  Sensory/Perceptual disturbances:   WNL  Orientation: oriented to person, place, and situation  Attention: Good  Concentration: Good  Memory: WNL  Fund of knowledge:  Good  Insight:   Good  Judgment:  Good  Impulse Control: Good   Risk Assessment: Danger to Self:  No Patient denied current suicidal ideation  Self-injurious Behavior: No Danger to Others: No Patient denied current homicidal ideation Duty to Warn:no Physical Aggression / Violence:No  Access to Firearms a concern: No  Gang Involvement:No   Subjective: Patient stated, "I'm doing good" in response to events since last session. Patient stated, "things are getting better", "I think the anxiety's better, I think I'm getting this feelings versus facts ratio back to where it should be", "I'm not letting the feelings mask the reality". Patient stated, "some of it I'm coming to accept is part of aging".  Patient reported no entries in patient's thought record. Patient stated, "getting things done gives me satisfaction", "that satisfaction dispels anxiety". Patient stated, "I'm just recognizing that feelings are not as valuable as facts". Patient stated,  "part of it is an aging thing, I can't do what I use to do", "until the last year or so that was not a reality". Patient reported he does not exercise regularly. Patient reported he completes crossword puzzles daily. Patient reported he likes to challenge himself by  designing/making/building items. Patient reported he was diagnosed with hypertension and diabetes. Patient reported his blood pressure and diabetes is currently controlled and monitored. Patient stated, "I get quality sleep", "it worries me that I don't sleep enough". Patient reported he gets 6 hours of sleep per night and reported feeling well rested when he wakes up.   Interventions: Cognitive Behavioral Therapy. Clinician conducted session in person at clinician's office at [facility]. Reviewed events since last session. Assessed the intensity and frequency of symptoms of anxiety. Reviewed patient's thought record. Explored patient's response to feelings of anxiety and patient's thoughts associated with feelings of anxiety. Discussed patient's thoughts/feelings related to aging. Normalized changes associated with aging. Provided psycho education related to brain health. Provided reflective listening and validation. Clinician requested patient continue thought record for homework.    Collaboration of Care: Other not required at this time   Diagnosis:  Anxiety disorder, unspecified type     Plan: Patient is to utilize Cognitive Behavioral Therapy, thought re-framing, mindfulness and coping strategies to decrease symptoms associated with their diagnosis. Frequency: bi-weekly  Modality: individual      Long-term goal:   Reduce overall level, frequency, and intensity of the feelings of anxiety as evidenced by decrease in "feelings of dread", worry, and feeling unsettled from 4 to 5 times/week to 0 to 1 times/week per patient's report for at least 3 consecutive months. Target Date: 01/20/24  Progress: progressing    Short-term goal:  Develop an understanding of the relationship between feelings of anxiety and their impact on thinking patterns and behaviors. Target Date: 07/21/23  Progress: progressing    Patient stated, "  to be satisfied with life" and verbalize hopeful and positive  statements regarding patient's future Target Date: 07/21/23  Progress: progressing                                Doree Barthel, LCSW

## 2023-03-19 NOTE — Progress Notes (Signed)
                Dezi Schaner, LCSW 

## 2023-04-15 ENCOUNTER — Ambulatory Visit: Payer: Medicare HMO | Admitting: Clinical

## 2023-04-15 DIAGNOSIS — F419 Anxiety disorder, unspecified: Secondary | ICD-10-CM

## 2023-04-15 NOTE — Progress Notes (Signed)
 [facility] Counselor/Therapist Progress Note  Patient ID: Dillon Henry, MRN: [id number],    Date: 04/15/2023  Time Spent: 8:37am - 9:34am : 57 minutes   Treatment Type: Individual Therapy  Reported Symptoms: Patient reported feelings of dread at times  Mental Status Exam: Appearance:  Neat and Well Groomed     Behavior: Appropriate  Motor: Normal  Speech/Language:  Clear and Coherent  Affect: Appropriate  Mood: normal  Thought process: normal  Thought content:   WNL  Sensory/Perceptual disturbances:   WNL  Orientation: oriented to person, place, and situation  Attention: Good  Concentration: Good  Memory: WNL  Fund of knowledge:  Good  Insight:   Good  Judgment:  Good  Impulse Control: Good   Risk Assessment: Danger to Self:  No Patient denied current suicidal ideation  Self-injurious Behavior: No Danger to Others: No Patient denied current homicidal ideation Duty to Warn:no Physical Aggression / Violence:No  Access to Firearms a concern: No  Gang Involvement:No   Subjective: Patient stated, "good" in response to events since last session. Patient reported patient was told by his mother and his wife that he goes "overboard" at times. Patient stated, "her (wife) life is worse than mine" and stated, "I couldn't imagine living their (wife/mother) life". Patient stated, "to me their lives are the epitome of monotony". Patient stated, "things in my mind seem to get out of hand and they didn't use to". Patient stated, "they (mother/wife) have both assured me it's not as bad as I think it is". Patient reported his son passed away 1 year ago and stated, "that was worst time of my life". Patient stated, "I wonder if there is something there" in reference to son's passing. Patient stated, "I'm struggling to adapt to getting older mentally". Patient reported difficulty with "getting older the concept".  Patient reported he worries and stated, "I worry about something  that's not that important". Patient reported he experiences feelings of dread and stated, "I don't know that its going away but I'm getting use to it. Patient reported when he is able to be outside the feeling of dread goes away. Patient stated, "I feel like part of the problem was the change caught me off guard". Patient stated, "Its not a thought, Its just a feeling" in reference to dread. Patient stated,  "I don't know what the dread comes from". Patient stated, "I think the worst thing about my mood is winter". Patient stated, "life is good".   Interventions: Cognitive Behavioral Therapy. Clinician conducted session in person at clinician's office at [facility]. Reviewed events since last session. Discussed feedback patient received from his wife and mother. Assisted patient in examining patient's thoughts/feelings associated with wife/mother's lives.   Challenged patient's statements/assumptions and assisted patient in reframing situations. Provided psycho education related to cognitive distortions and challenging distortions. Provided psycho education related to triggers. Explored patient's thoughts/feelings related to aging. Provided psycho education related to normal age related changes. Assisted patient in exploring triggers for feelings of dread and thoughts associated with dread. Assessed patient's mood. Clinician requested for homework patient practice challenging cognitive distortions/negative thoughts.    Collaboration of Care: Other not required at this time   Diagnosis:  Anxiety disorder, unspecified type     Plan: Patient is to utilize Cognitive Behavioral Therapy, thought re-framing, mindfulness and coping strategies to decrease symptoms associated with their diagnosis. Frequency: bi-weekly  Modality: individual      Long-term goal:   Reduce overall level, frequency, and  intensity of the feelings of anxiety as evidenced by decrease in "feelings of dread", worry, and feeling  unsettled from 4 to 5 times/week to 0 to 1 times/week per patient's report for at least 3 consecutive months. Target Date: 01/20/24  Progress: progressing    Short-term goal:  Develop an understanding of the relationship between feelings of anxiety and their impact on thinking patterns and behaviors. Target Date: 07/21/23  Progress: progressing    Patient stated, "to be satisfied with life" and verbalize hopeful and positive statements regarding patient's future Target Date: 07/21/23  Progress: progressing                      Burlene Carpen, LCSW

## 2023-04-15 NOTE — Progress Notes (Signed)
   Dillon Barthel, LCSW

## 2023-05-06 ENCOUNTER — Ambulatory Visit: Payer: Medicare HMO | Admitting: Clinical

## 2023-05-06 DIAGNOSIS — F419 Anxiety disorder, unspecified: Secondary | ICD-10-CM

## 2023-05-06 NOTE — Progress Notes (Signed)
 [facility] Counselor/Therapist Progress Note  Patient ID: Dillon Henry, MRN: [id number],    Date: 05/06/2023  Time Spent: 8:35am - 9:24am : 49 minutes   Treatment Type: Individual Therapy  Reported Symptoms: none reported  Mental Status Exam: Appearance:  Neat and Well Groomed     Behavior: Appropriate  Motor: Normal  Speech/Language:  Clear and Coherent and Normal Rate  Affect: Appropriate  Mood: normal  Thought process: normal  Thought content:   WNL  Sensory/Perceptual disturbances:   WNL  Orientation: oriented to person, place, and situation  Attention: Good  Concentration: Good  Memory: WNL  Fund of knowledge:  Good  Insight:   Good  Judgment:  Good  Impulse Control: Good   Risk Assessment: Danger to Self:  No Patient denied current suicidal ideation  Self-injurious Behavior: No Danger to Others: No Patient denied current homicidal ideation Duty to Warn:no Physical Aggression / Violence:No  Access to Firearms a concern: No  Gang Involvement:No   Subjective: Patient stated, Im doing really well in response to events since last session. Patient stated, I kind of questioned those things (thoughts) and it goes from feeling to fact, I am at the point of realizing change is inevitable. Patient reported he is facing several people near death in  his community. Patient stated, Its realizing I'm not going to be any different than anybody else and reported he is accepting change. Patient stated, I feel good. Patient stated, Its an acceptance of the fact that I'm in a different stage of life, this is a change. Patient stated, I am not living today obsessing about that (decline in health). Patient reported feeling frustrated when activities impact patient's ability to accomplish tasks patient wants to complete. Patient stated, I'm doing well in response to patient's mood. Patient stated, I really do think that it has helped in reference to  therapy. Patient reported my peace of mind has returned. Patient stated, it's momentary when experiencing a sense of dread. Patient stated, I'm getting use to the fact that I can't pick up things as much as I use to could or carry things as much as I use to could. Patient stated, Its been good in reference to patient's mood. Patient stated, my thought is I see that the issue has been satisfactorily resolved,  at this point I really don't want to schedule any more in reference to therapy sessions. Patient stated, It really doesn't even happen in reference to feeling a sense of dread.   Interventions: Cognitive Behavioral Therapy. Clinician conducted session in person at clinician's office at [facility]. Reviewed events since last session. Reviewed patient's implementation of challenging cognitive distortions and the outcome. Explored changes in patient's perception as it relates to aging. Assessed frequency and intensity of symptoms of anxiety. Assessed patient's mood since last session and current mood. Discussed patient's decision to discontinue therapy and patient's progress in therapy. Provided reflective listening and validation.    Collaboration of Care: Other not required at this time   Diagnosis:  Anxiety disorder, unspecified type     Plan: Patient is to utilize Cognitive Behavioral Therapy, thought re-framing, mindfulness and coping strategies to decrease symptoms associated with their diagnosis. Frequency: bi-weekly  Modality: individual      Long-term goal:   Reduce overall level, frequency, and intensity of the feelings of anxiety as evidenced by decrease in feelings of dread, worry, and feeling unsettled from 4 to 5 times/week to 0 to 1 times/week per patient's report for  at least 3 consecutive months. Target Date: 01/20/24  Progress: progressing    Short-term goal:  Develop an understanding of the relationship between feelings of anxiety and their impact on  thinking patterns and behaviors. Target Date: 07/21/23  Progress: progressing    Patient stated, to be satisfied with life and verbalize hopeful and positive statements regarding patient's future Target Date: 07/21/23  Progress: progressing                Darice Seats, LCSW

## 2023-05-06 NOTE — Progress Notes (Signed)
   Dillon Barthel, LCSW

## 2023-08-13 ENCOUNTER — Encounter (INDEPENDENT_AMBULATORY_CARE_PROVIDER_SITE_OTHER): Payer: Medicare HMO | Admitting: Ophthalmology

## 2023-08-13 DIAGNOSIS — Z7984 Long term (current) use of oral hypoglycemic drugs: Secondary | ICD-10-CM

## 2023-08-13 DIAGNOSIS — I10 Essential (primary) hypertension: Secondary | ICD-10-CM | POA: Diagnosis not present

## 2023-08-13 DIAGNOSIS — H43813 Vitreous degeneration, bilateral: Secondary | ICD-10-CM

## 2023-08-13 DIAGNOSIS — H35371 Puckering of macula, right eye: Secondary | ICD-10-CM | POA: Diagnosis not present

## 2023-08-13 DIAGNOSIS — E113513 Type 2 diabetes mellitus with proliferative diabetic retinopathy with macular edema, bilateral: Secondary | ICD-10-CM

## 2023-08-13 DIAGNOSIS — Z794 Long term (current) use of insulin: Secondary | ICD-10-CM

## 2023-08-13 DIAGNOSIS — H35033 Hypertensive retinopathy, bilateral: Secondary | ICD-10-CM | POA: Diagnosis not present

## 2024-01-08 ENCOUNTER — Encounter: Payer: Self-pay | Admitting: Occupational Therapy

## 2024-01-08 ENCOUNTER — Ambulatory Visit: Admitting: Occupational Therapy

## 2024-01-08 DIAGNOSIS — G5621 Lesion of ulnar nerve, right upper limb: Secondary | ICD-10-CM | POA: Diagnosis present

## 2024-01-08 DIAGNOSIS — M6281 Muscle weakness (generalized): Secondary | ICD-10-CM | POA: Diagnosis present

## 2024-01-08 NOTE — Therapy (Signed)
 OUTPATIENT OCCUPATIONAL THERAPY ORTHO EVALUATION  Patient Name: Dillon Henry MRN: [id number] DOB:[birth year], 73 y.o., male Today's Date: 01/08/2024  PCP: Alda Carpen, MD REFERRING PROVIDER: Jackquline Barrack, MD  END OF SESSION:  OT End of Session - 01/08/24 0810     Visit Number 1    Number of Visits 14    Date for Recertification  03/18/24    OT Start Time 0815    OT Stop Time 0900    OT Time Calculation (min) 45 min    Activity Tolerance Patient tolerated treatment well    Behavior During Therapy [facility] for tasks assessed/performed          Past Medical History:  Diagnosis Date   Arthritis    osteoarthritis   Charcot's arthropathy    Diabetes mellitus 1994   Type II   Diabetic retinopathy (HCC)    Dysrhythmia    AF, history of an ablation in 2005   Hammer toe of left foot    Hyperlipidemia 05/1997   Hypertension 1995   Neuromuscular disorder (HCC)    polyneuropathies d/t diabetes   Osteomyelitis (HCC) 01/2020   left great toe   Skin ulcer (HCC) 01/2020   full thickness ulceration over IPJ.   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   Past Surgical History:  Procedure Laterality Date   AMPUTATION TOE Left 02/17/2020   Procedure: AMPUTATION TOE MPJ LEFT;  Surgeon: Neill Boas, DPM;  Location: [facility] ORS;  Service: Podiatry;  Laterality: Left;   ATRIAL ABLATION SURGERY  2005   For A Fib   COLONOSCOPY WITH PROPOFOL  N/A 12/23/2016   Procedure: COLONOSCOPY WITH PROPOFOL ;  Surgeon: Gaylyn Gladis PENNER, MD;  Location: [facility] ENDOSCOPY;  Service: Endoscopy;  Laterality: N/A;   COLONOSCOPY WITH PROPOFOL  N/A 03/09/2023   Procedure: COLONOSCOPY WITH PROPOFOL ;  Surgeon: Maryruth Ole DASEN, MD;  Location: [facility] ENDOSCOPY;  Service: Endoscopy;  Laterality: N/A;   DOBUTAMINE  STRESS ECHO  11/26/2005   Nonsust VTach HTSVE ECG NML   DOPPLER ECHOCARDIOGRAPHY  1999   Normal, EF 60%   DOPPLER ECHOCARDIOGRAPHY  01/11/2001   Mild CMC, LVH, TR MR, TR: ([place])    ETT  06/1998   Normal   Exercise tolerance test  05/1998   OK   POLYPECTOMY  03/09/2023   Procedure: POLYPECTOMY;  Surgeon: Maryruth Ole DASEN, MD;  Location: [facility] ENDOSCOPY;  Service: Endoscopy;;   Stress cardilite  12/1997   Normal   Stress cardiolite  03/02/2002   WNL  EF 70%   TOE AMPUTATION     TONSILLECTOMY AND ADENOIDECTOMY     TOTAL KNEE ARTHROPLASTY Right 10/09/2020   Procedure: TOTAL KNEE ARTHROPLASTY - [place] Amber to Assist;  Surgeon: Kathlynn Sharper, MD;  Location: [facility] ORS;  Service: Orthopedics;  Laterality: Right;   VASECTOMY     Patient Active Problem List   Diagnosis Date Noted   S/P TKR (total knee replacement) using cement, right 10/09/2020   HAMMER TOE 01/25/2010   Vitamin D deficiency 01/31/2009   ERECTILE DYSFUNCTION 10/27/2007   KNEE PAIN, RIGHT 06/15/2007   DIABETES MELLITUS, TYPE II 03/08/2007   HYPERLIPIDEMIA 03/08/2007   Essential hypertension 03/08/2007   FIBRILLATION, ATRIAL 03/05/2007    ONSET DATE: 12/23/23  REFERRING DIAG: Acute onset R hand weakness  THERAPY DIAG:  Cubital tunnel syndrome, right  Muscle weakness (generalized)  Rationale for Evaluation and Treatment: Rehabilitation  SUBJECTIVE:   SUBJECTIVE STATEMENT: I had pain in my R pinky and elbow then sudden loss of dexterity  and strength in my pinch. Pt accompanied by: self  PERTINENT HISTORY: Pt is referred for cubital tunnel syndrome with loss of dexterity in R hand.   PRECAUTIONS: None  RED FLAGS: None   WEIGHT BEARING RESTRICTIONS: No  PAIN:  Are you having pain? Yes: NPRS scale: 3/10 Pain location: arthritic hand pain  FALLS: Has patient fallen in last 6 months? No  LIVING ENVIRONMENT: Lives with: lives with their family Lives in: House/apartment  PLOF: Independent plays guitar and piano, works on cars/machines  PATIENT GOALS: To regain function in R hand   NEXT MD VISIT: Scheduling a nerve conduction test   OBJECTIVE:  Note: Objective measures were  completed at Evaluation unless otherwise noted.  HAND DOMINANCE: Right  ADLs: Overall ADLs: requires modification to complete ADLs including handwriting, turn keys    UPPER EXTREMITY ROM:     Active ROM Right eval Left eval  Shoulder flexion    Shoulder abduction    Shoulder adduction    Shoulder extension    Shoulder internal rotation    Shoulder external rotation    Elbow flexion    Elbow extension    Wrist flexion 55   Wrist extension 35   Wrist ulnar deviation    Wrist radial deviation 10   Wrist pronation    Wrist supination    (Blank rows = not tested)  Active ROM Right eval Left eval  Thumb MCP (0-60)    Thumb IP (0-80)    Thumb Radial abd/add (0-55)     Thumb Palmar abd/add (0-45)     Thumb Opposition to Small Finger     Index MCP (0-90)     Index PIP (0-100)     Index DIP (0-70)      Long MCP (0-90)      Long PIP (0-100)      Long DIP (0-70)      Ring MCP (0-90)      Ring PIP (0-100)      Ring DIP (0-70)      Little MCP (0-90)      Little PIP (0-100)      Little DIP (0-70)      (Blank rows = not tested)   UPPER EXTREMITY MMT:   B WFL    HAND FUNCTION: Grip strength: Right: 59 lbs; Left: 69 lbs, Lateral pinch: Right: 6 lbs, Left: 19 lbs, and 3 point pinch: Right: 4 lbs, Left: 16 lbs  COORDINATION:  15 sec L and R hand to pick up and replace 5 beads  SENSATION: WFL  EDEMA: none  COGNITION: Overall cognitive status: Within functional limits for tasks assessed  OBSERVATIONS: arthritis in R [facility] joint   TREATMENT DATE: 01/08/24                                                                                                                            Measurements obtained  Reviewed task modification including built up handles and use of  towel roll to maintain elbow extension at night.  Soft tissue massage thenar webspace with significant tightness noted, clip used 10 sec x 3, educated to complete at home Reviewed HEP with handout provided  and completed 1 set x 10 reps each Exercises - Seated Wrist Flexion Stretch  - Seated Wrist Extension Stretch  - Seated Wrist Radial and Ulnar Deviation AROM   - Ulnar Nerve Flossing   - Ulnar Nerve Mobilization - Low Level   - Wrist Prayer Stretch at Table   - Thumb Stabilization Palmar Abduction: Isotonic       PATIENT EDUCATION: PATIENT EDUCATION: Education details: findings of eval and HEP  Person educated: Patient Education method: Explanation, Demonstration, Tactile cues, Verbal cues, and Handouts Education comprehension: verbalized understanding, returned demonstration, verbal cues required, and needs further education   HOME EXERCISE PROGRAM: Access Code: [email or web address] URL: [email or web address] Date: 01/08/2024 Prepared by: Makylee Sanborn  Program Notes Massage webspace between thumb and index finger, can use a chip clip for pressure. Complete stretches 2x/day. Sleep with brace or towel wrap preventing elbows from bending.   Exercises - Seated Wrist Flexion Stretch  - Seated Wrist Extension Stretch  - Seated Wrist Radial and Ulnar Deviation AROM   - Ulnar Nerve Flossing   - Ulnar Nerve Mobilization - Low Level   - Wrist Prayer Stretch at Table   - Thumb Stabilization Palmar Abduction with rubberband  GOALS: Goals reviewed with patient? Yes  SHORT TERM GOALS: Target date: 02/12/24  Pt will implement x3 task modifications for joint protection and minimize nerve compression.  Baseline: none Goal status: INITIAL  2.  Pt will Independently complete HEP for functional strengthening and ROM Baseline: none Goal status: INITIAL    LONG TERM GOALS: Target date: 03/18/24  Pt will increase R wrist flexion/extension AROM to WNL with pain less than 2/10. Baseline: flexion 55*, extension 35* Goal status: INITIAL  2.  Pt will increase R grip strength by 10# to open jars  Baseline: 59# Goal status: INITIAL  3.  Pt will increase R lateral pinch strength by  10# to turn car key in ignition Baseline: 6# Goal status: INITIAL  4.  Pt will increase R 3 point pinch strength by 10# to hold ADL items  Baseline: 4# Goal status: INITIAL   ASSESSMENT:  CLINICAL IMPRESSION: Patient seen today for occupational therapy evaluation for loss of strength and dexterity in dominant R hand, concern for cubital tunnel syndrome. Noted deficits in dominant R grip strength, R wrist AROM, and significant weakness in lateral and 3 pt pinch strength. Pt demonstrates good understanding of task modification, plan to trial built up handles for utensils. Provided HEP including wrist stretches, ulnar nerve glides, and thumb palmar abduction with rubber band. Patient can benefit from skilled OT services to increase dominant RUE functional strengthening and AROM as well as joint protection and modification education to return to prior level of function.    PERFORMANCE DEFICITS: in functional skills including ADLs, IADLs, ROM, strength, pain, flexibility, decreased knowledge of use of DME, and UE functional use,   and psychosocial skills including environmental adaptation and routines and behaviors.   IMPAIRMENTS: are limiting patient from ADLs, IADLs, rest and sleep, play, leisure, and social participation.   COMORBIDITIES: has no other co-morbidities that affects occupational performance. Patient will benefit from skilled OT to address above impairments and improve overall function.  MODIFICATION OR ASSISTANCE TO COMPLETE EVALUATION: No modification of tasks or assist necessary to complete an evaluation.  OT OCCUPATIONAL PROFILE  AND HISTORY: Problem focused assessment: Including review of records relating to presenting problem.  CLINICAL DECISION MAKING: LOW - limited treatment options, no task modification necessary  REHAB POTENTIAL: Good for goals  EVALUATION COMPLEXITY: Low        PLAN:  OT FREQUENCY: 1-2x/week  OT DURATION: 10 weeks  PLANNED INTERVENTIONS:  97535 self care/ADL training, 02889 therapeutic exercise, 97530 therapeutic activity, 97010 moist heat, and 97034 contrast bath  RECOMMENDED OTHER SERVICES: none  CONSULTED AND AGREED WITH PLAN OF CARE: Patient  PLAN FOR NEXT SESSION: HEP review   Elston Slot, M.S. OTR/L  01/08/24, 12:33 PM  ascom [phone]  Elston JINNY Slot, OT 01/08/2024, 12:33 PM

## 2024-01-12 ENCOUNTER — Ambulatory Visit: Admitting: Occupational Therapy

## 2024-01-15 ENCOUNTER — Ambulatory Visit: Admitting: Occupational Therapy

## 2024-01-15 DIAGNOSIS — M6281 Muscle weakness (generalized): Secondary | ICD-10-CM

## 2024-01-15 DIAGNOSIS — G5621 Lesion of ulnar nerve, right upper limb: Secondary | ICD-10-CM | POA: Diagnosis not present

## 2024-01-15 NOTE — Therapy (Signed)
 OUTPATIENT OCCUPATIONAL THERAPY ORTHO TREATMENT Patient Name: Dillon Henry MRN: [id number] DOB:Nov 20, 1950, 73 y.o., male Today's Date: 01/15/2024  PCP: Alda Carpen, MD REFERRING PROVIDER: Jackquline Barrack, MD  END OF SESSION:  OT End of Session - 01/15/24 0919     Visit Number 2    Number of Visits 14    Date for Recertification  03/18/24    OT Start Time 0900    OT Stop Time 0951    OT Time Calculation (min) 51 min    Activity Tolerance Patient tolerated treatment well    Behavior During Therapy [facility] for tasks assessed/performed          Past Medical History:  Diagnosis Date   Arthritis    osteoarthritis   Charcot's arthropathy    Diabetes mellitus 1994   Type II   Diabetic retinopathy (HCC)    Dysrhythmia    AF, history of an ablation in 2005   Hammer toe of left foot    Hyperlipidemia 05/1997   Hypertension 1995   Neuromuscular disorder (HCC)    polyneuropathies d/t diabetes   Osteomyelitis (HCC) 01/2020   left great toe   Skin ulcer (HCC) 01/2020   full thickness ulceration over IPJ.   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   Past Surgical History:  Procedure Laterality Date   AMPUTATION TOE Left 02/17/2020   Procedure: AMPUTATION TOE MPJ LEFT;  Surgeon: Neill Boas, DPM;  Location: [facility] ORS;  Service: Podiatry;  Laterality: Left;   ATRIAL ABLATION SURGERY  2005   For A Fib   COLONOSCOPY WITH PROPOFOL  N/A 12/23/2016   Procedure: COLONOSCOPY WITH PROPOFOL ;  Surgeon: Gaylyn Gladis PENNER, MD;  Location: [facility] ENDOSCOPY;  Service: Endoscopy;  Laterality: N/A;   COLONOSCOPY WITH PROPOFOL  N/A 03/09/2023   Procedure: COLONOSCOPY WITH PROPOFOL ;  Surgeon: Maryruth Ole DASEN, MD;  Location: [facility] ENDOSCOPY;  Service: Endoscopy;  Laterality: N/A;   DOBUTAMINE  STRESS ECHO  11/26/2005   Nonsust VTach HTSVE ECG NML   DOPPLER ECHOCARDIOGRAPHY  1999   Normal, EF 60%   DOPPLER ECHOCARDIOGRAPHY  01/11/2001   Mild CMC, LVH, TR MR, TR: ([place])   ETT   06/1998   Normal   Exercise tolerance test  05/1998   OK   POLYPECTOMY  03/09/2023   Procedure: POLYPECTOMY;  Surgeon: Maryruth Ole DASEN, MD;  Location: [facility] ENDOSCOPY;  Service: Endoscopy;;   Stress cardilite  12/1997   Normal   Stress cardiolite  03/02/2002   WNL  EF 70%   TOE AMPUTATION     TONSILLECTOMY AND ADENOIDECTOMY     TOTAL KNEE ARTHROPLASTY Right 10/09/2020   Procedure: TOTAL KNEE ARTHROPLASTY - [place] Amber to Assist;  Surgeon: Kathlynn Sharper, MD;  Location: [facility] ORS;  Service: Orthopedics;  Laterality: Right;   VASECTOMY     Patient Active Problem List   Diagnosis Date Noted   S/P TKR (total knee replacement) using cement, right 10/09/2020   HAMMER TOE 01/25/2010   Vitamin D deficiency 01/31/2009   ERECTILE DYSFUNCTION 10/27/2007   KNEE PAIN, RIGHT 06/15/2007   DIABETES MELLITUS, TYPE II 03/08/2007   HYPERLIPIDEMIA 03/08/2007   Essential hypertension 03/08/2007   FIBRILLATION, ATRIAL 03/05/2007    ONSET DATE: 12/23/23  REFERRING DIAG: Acute onset R hand weakness  THERAPY DIAG:  Cubital tunnel syndrome, right  Muscle weakness (generalized)  Rationale for Evaluation and Treatment: Rehabilitation  SUBJECTIVE:   SUBJECTIVE STATEMENT: I woke up 1 morning and I had weakness in my thumb and my pinching.  I could not hold my fork on my toothbrush.  Mostly my index finger my thumb.  I have neuropathy from diabetes with old that moved into my hand. Pt accompanied by: self  PERTINENT HISTORY: Pt is referred for cubital tunnel syndrome with loss of dexterity in R hand.   PRECAUTIONS: None  RED FLAGS: None   WEIGHT BEARING RESTRICTIONS: No  PAIN:  Are you having pain? Yes: NPRS scale: 3/10 Pain location: arthritic hand pain  FALLS: Has patient fallen in last 6 months? No  LIVING ENVIRONMENT: Lives with: lives with their family Lives in: House/apartment  PLOF: Independent plays guitar and piano, works on cars/machines  PATIENT GOALS: To regain  function in R hand   NEXT MD VISIT: Scheduling a nerve conduction test -end of October  OBJECTIVE:  Note: Objective measures were completed at Evaluation unless otherwise noted.  HAND DOMINANCE: Right  ADLs: Overall ADLs: requires modification to complete ADLs including handwriting, turn keys    UPPER EXTREMITY ROM:     Active ROM Right eval Left eval  Shoulder flexion    Shoulder abduction    Shoulder adduction    Shoulder extension    Shoulder internal rotation    Shoulder external rotation    Elbow flexion    Elbow extension    Wrist flexion 55   Wrist extension 35   Wrist ulnar deviation    Wrist radial deviation 10   Wrist pronation    Wrist supination    (Blank rows = not tested)  Active ROM Right eval Left eval  Thumb MCP (0-60)    Thumb IP (0-80)    Thumb Radial abd/add (0-55)     Thumb Palmar abd/add (0-45)     Thumb Opposition to Small Finger     Index MCP (0-90)     Index PIP (0-100)     Index DIP (0-70)      Long MCP (0-90)      Long PIP (0-100)      Long DIP (0-70)      Ring MCP (0-90)      Ring PIP (0-100)      Ring DIP (0-70)      Little MCP (0-90)      Little PIP (0-100)      Little DIP (0-70)      (Blank rows = not tested)   UPPER EXTREMITY MMT:   B WFL    HAND FUNCTION: Grip strength: Right: 59 lbs; Left: 69 lbs, Lateral pinch: Right: 6 lbs, Left: 19 lbs, and 3 point pinch: Right: 4 lbs, Left: 16 lbs  COORDINATION:  15 sec L and R hand to pick up and replace 5 beads  SENSATION: WFL  EDEMA: none  COGNITION: Overall cognitive status: Within functional limits for tasks assessed  OBSERVATIONS: arthritis in R [facility] joint   TREATMENT DATE: 01/15/24  Patient with increased tightness and stiffness especially in the flexors of the hand and forearm -unable to extend digits fully, thumb CMC adducted into the palm,  decreased wrist extension  Patient with arthritic changes in the thumb as well as digits. Discussed and educated  patient anatomy extensively for the thumb CMC, digits as well as carpal tunnel-as well as activity and analysis of patient's free time activities Patient report fingers close mostly numb when holding his phone or tablet Likes to work on the cars and around the house using tools and cutting sheet-metal  Paraffin done 8 minutes followed by soft tissue mobilization and massage opening carpal tunnel and metacarpals as well as webspace Patient was educated to do at home and wife to assist Rolling also over the foam roller flexors of the digits as well as forearm Tunnel light static 5 cm dry cupping on right forearm to and in the palm followed by soft tissue mobilization and stretches for wrist extension digit extension 10 reps Repeated 2-3 times Patient to do prayer stretch and reviewed with patient 10 reps 12 reps pain-free modified to the front As well as radial abduction of the thumb 12 reps   Patient did not had a positive Tinel at tenderness in the cubital tunnel reported more of his symptoms in the radial hand and the ulnar hand No positive Tinel or Phalen's at the carpal tunnel but do have tenderness CMC tender and large and with opposition and lateral pinch and gripping thumb CMC collapses into the carpal tunnel observed multiple times in session  Reviewed task modification including built up handles and using larger joints like palms and forearm to pick up and carry objects  Reviewed with him holding phone and tablet.   Fitted patient with a CMC neoprene wrap to assist him Thumb CMC out of the carpal tunnel at nighttime but also when reading or on the phone or performing activities involving the thumb and a lateral 3-point pinch or grip   Patient has a paraffin bath at home that his wife use.  Recommend for patient to do that 2-3 times a day prior to soft tissue mobilization  and stretches - Wrist Prayer Stretch at Table   Radial abduction     PATIENT EDUCATION: PATIENT EDUCATION: Education details: findings of eval and HEP  Person educated: Patient Education method: Explanation, Demonstration, Tactile cues, Verbal cues, and Handouts Education comprehension: verbalized understanding, returned demonstration, verbal cues required, and needs further education   GOALS: Goals reviewed with patient? Yes  SHORT TERM GOALS: Target date: 02/12/24  Pt will implement x3 task modifications for joint protection and minimize nerve compression.  Baseline: none Goal status: INITIAL  2.  Pt will Independently complete HEP for functional strengthening and ROM Baseline: none Goal status: INITIAL    LONG TERM GOALS: Target date: 03/18/24  Pt will increase R wrist flexion/extension AROM to WNL with pain less than 2/10. Baseline: flexion 55*, extension 35* Goal status: INITIAL  2.  Pt will increase R grip strength by 10# to open jars  Baseline: 59# Goal status: INITIAL  3.  Pt will increase R lateral pinch strength by 10# to turn car key in ignition Baseline: 6# Goal status: INITIAL  4.  Pt will increase R 3 point pinch strength by 10# to hold ADL items  Baseline: 4# Goal status: INITIAL   ASSESSMENT:  CLINICAL IMPRESSION: Patient seen today for occupational therapy evaluation for loss of strength and dexterity in dominant R hand, concern for cubital  tunnel syndrome. Noted deficits in dominant R grip strength, R wrist AROM, and significant weakness in lateral and 3 pt pinch strength.  Patient with increased tightness in the flexors of the hand and forearm.  With arthritic changes in the digits and especially thumb CMC.  Observed with patient gripping and pinching thumb CMC drops into the carpal tunnel.  Done paraffin with patient for moist heat with focusing on soft tissue mobilization and stretches to open the hand and digit extension and wrist extension.   Patient fitted with a CMC neoprene wrap to Thumb CMC out of the carpal tunnel with functional task.  Pt demonstrates good understanding of task modification, plan to  built up handles for utensils/and tools and use larger joints like palms of forearm to pick up and carry objects. Patient can benefit from skilled OT services to increase dominant RUE functional strengthening and AROM as well as joint protection and modification education to return to prior level of function.    PERFORMANCE DEFICITS: in functional skills including ADLs, IADLs, ROM, strength, pain, flexibility, decreased knowledge of use of DME, and UE functional use,   and psychosocial skills including environmental adaptation and routines and behaviors.   IMPAIRMENTS: are limiting patient from ADLs, IADLs, rest and sleep, play, leisure, and social participation.   COMORBIDITIES: has no other co-morbidities that affects occupational performance. Patient will benefit from skilled OT to address above impairments and improve overall function.  MODIFICATION OR ASSISTANCE TO COMPLETE EVALUATION: No modification of tasks or assist necessary to complete an evaluation.  OT OCCUPATIONAL PROFILE AND HISTORY: Problem focused assessment: Including review of records relating to presenting problem.  CLINICAL DECISION MAKING: LOW - limited treatment options, no task modification necessary  REHAB POTENTIAL: Good for goals  EVALUATION COMPLEXITY: Low        PLAN:  OT FREQUENCY: 1-2x/week  OT DURATION: 10 weeks  PLANNED INTERVENTIONS: 97535 self care/ADL training, 02889 therapeutic exercise, 97530 therapeutic activity, 97010 moist heat, and 97034 contrast bath    CONSULTED AND AGREED WITH PLAN OF CARE: Patient       Ancel Peters, OTR/L,CLT 01/15/2024, 12:49 PM

## 2024-01-18 ENCOUNTER — Ambulatory Visit: Admitting: Occupational Therapy

## 2024-01-21 ENCOUNTER — Ambulatory Visit: Admitting: Occupational Therapy

## 2024-01-28 ENCOUNTER — Ambulatory Visit: Admitting: Occupational Therapy

## 2024-01-28 DIAGNOSIS — G5621 Lesion of ulnar nerve, right upper limb: Secondary | ICD-10-CM | POA: Diagnosis not present

## 2024-01-28 DIAGNOSIS — M6281 Muscle weakness (generalized): Secondary | ICD-10-CM

## 2024-01-28 NOTE — Therapy (Signed)
 OUTPATIENT OCCUPATIONAL THERAPY ORTHO TREATMENT Patient Name: Dillon Henry MRN: [id number] DOB:31-Jul-1950, 73 y.o., male Today's Date: 01/28/2024  PCP: Alda Carpen, MD REFERRING PROVIDER: Jackquline Barrack, MD  END OF SESSION:  OT End of Session - 01/28/24 1616     Visit Number 3    Number of Visits 14    Date for Recertification  03/18/24    OT Start Time 1615    OT Stop Time 1704    OT Time Calculation (min) 49 min    Activity Tolerance Patient tolerated treatment well    Behavior During Therapy [facility] for tasks assessed/performed          Past Medical History:  Diagnosis Date   Arthritis    osteoarthritis   Charcot's arthropathy    Diabetes mellitus 1994   Type II   Diabetic retinopathy (HCC)    Dysrhythmia    AF, history of an ablation in 2005   Hammer toe of left foot    Hyperlipidemia 05/1997   Hypertension 1995   Neuromuscular disorder (HCC)    polyneuropathies d/t diabetes   Osteomyelitis (HCC) 01/2020   left great toe   Skin ulcer (HCC) 01/2020   full thickness ulceration over IPJ.   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   Past Surgical History:  Procedure Laterality Date   AMPUTATION TOE Left 02/17/2020   Procedure: AMPUTATION TOE MPJ LEFT;  Surgeon: Neill Boas, DPM;  Location: [facility] ORS;  Service: Podiatry;  Laterality: Left;   ATRIAL ABLATION SURGERY  2005   For A Fib   COLONOSCOPY WITH PROPOFOL  N/A 12/23/2016   Procedure: COLONOSCOPY WITH PROPOFOL ;  Surgeon: Gaylyn Gladis PENNER, MD;  Location: [facility] ENDOSCOPY;  Service: Endoscopy;  Laterality: N/A;   COLONOSCOPY WITH PROPOFOL  N/A 03/09/2023   Procedure: COLONOSCOPY WITH PROPOFOL ;  Surgeon: Maryruth Ole DASEN, MD;  Location: [facility] ENDOSCOPY;  Service: Endoscopy;  Laterality: N/A;   DOBUTAMINE  STRESS ECHO  11/26/2005   Nonsust VTach HTSVE ECG NML   DOPPLER ECHOCARDIOGRAPHY  1999   Normal, EF 60%   DOPPLER ECHOCARDIOGRAPHY  01/11/2001   Mild CMC, LVH, TR MR, TR: ([place])   ETT   06/1998   Normal   Exercise tolerance test  05/1998   OK   POLYPECTOMY  03/09/2023   Procedure: POLYPECTOMY;  Surgeon: Maryruth Ole DASEN, MD;  Location: [facility] ENDOSCOPY;  Service: Endoscopy;;   Stress cardilite  12/1997   Normal   Stress cardiolite  03/02/2002   WNL  EF 70%   TOE AMPUTATION     TONSILLECTOMY AND ADENOIDECTOMY     TOTAL KNEE ARTHROPLASTY Right 10/09/2020   Procedure: TOTAL KNEE ARTHROPLASTY - [place] Amber to Assist;  Surgeon: Kathlynn Sharper, MD;  Location: [facility] ORS;  Service: Orthopedics;  Laterality: Right;   VASECTOMY     Patient Active Problem List   Diagnosis Date Noted   S/P TKR (total knee replacement) using cement, right 10/09/2020   HAMMER TOE 01/25/2010   Vitamin D deficiency 01/31/2009   ERECTILE DYSFUNCTION 10/27/2007   KNEE PAIN, RIGHT 06/15/2007   DIABETES MELLITUS, TYPE II 03/08/2007   HYPERLIPIDEMIA 03/08/2007   Essential hypertension 03/08/2007   FIBRILLATION, ATRIAL 03/05/2007    ONSET DATE: 12/23/23  REFERRING DIAG: Acute onset R hand weakness  THERAPY DIAG:  Cubital tunnel syndrome, right  Muscle weakness (generalized)  Rationale for Evaluation and Treatment: Rehabilitation  SUBJECTIVE:   SUBJECTIVE STATEMENT: I have been doing some of the stuff you told me.  I can tell  a difference.  But I have not been doing all.  I have the nerve conduction test which showed something but my carpal tunnel and at my elbow. Pt accompanied by: self  PERTINENT HISTORY: Pt is referred for cubital tunnel syndrome with loss of dexterity in R hand.   PRECAUTIONS: None  RED FLAGS: None   WEIGHT BEARING RESTRICTIONS: No  PAIN:  Are you having pain? No pain reported  FALLS: Has patient fallen in last 6 months? No  LIVING ENVIRONMENT: Lives with: lives with their family Lives in: House/apartment  PLOF: Independent plays guitar and piano, works on cars/machines  PATIENT GOALS: To regain function in R hand   NEXT MD VISIT: Scheduling a nerve  conduction test -end of October  OBJECTIVE:  Note: Objective measures were completed at Evaluation unless otherwise noted.  HAND DOMINANCE: Right  ADLs: Overall ADLs: requires modification to complete ADLs including handwriting, turn keys    UPPER EXTREMITY ROM:     Active ROM Right eval Left eval  Shoulder flexion    Shoulder abduction    Shoulder adduction    Shoulder extension    Shoulder internal rotation    Shoulder external rotation    Elbow flexion    Elbow extension    Wrist flexion 55   Wrist extension 35   Wrist ulnar deviation    Wrist radial deviation 10   Wrist pronation    Wrist supination    (Blank rows = not tested)  Active ROM Right eval Left eval  Thumb MCP (0-60)    Thumb IP (0-80)    Thumb Radial abd/add (0-55)     Thumb Palmar abd/add (0-45)     Thumb Opposition to Small Finger     Index MCP (0-90)     Index PIP (0-100)     Index DIP (0-70)      Long MCP (0-90)      Long PIP (0-100)      Long DIP (0-70)      Ring MCP (0-90)      Ring PIP (0-100)      Ring DIP (0-70)      Little MCP (0-90)      Little PIP (0-100)      Little DIP (0-70)      (Blank rows = not tested)   UPPER EXTREMITY MMT:   B WFL    HAND FUNCTION: Grip strength: Right: 59 lbs; Left: 69 lbs, Lateral pinch: Right: 6 lbs, Left: 19 lbs, and 3 point pinch: Right: 4 lbs, Left: 16 lbs 01/28/24 Grip strength: Right: 54 lbs; Left: 69 lbs, Lateral pinch: Right: 8 lbs, Left: 19 lbs, and 3 point pinch: Right: 5 lbs, Left: 16 lbs and 2 point pinch R 3 lbs and L 9 lbs COORDINATION:  15 sec L and R hand to pick up and replace 5 beads  SENSATION: WFL  EDEMA: none  COGNITION: Overall cognitive status: Within functional limits for tasks assessed  OBSERVATIONS: arthritis in R [facility] joint   TREATMENT DATE: 01/28/24  Patient cont with increased tightness  and stiffness especially in the flexors of the hand and forearm -unable to extend digits fully, thumb CMC adducted into the palm, decreased wrist extension but improve in session Patient with arthritic changes in the thumb as well as digits. Discussed and educated  patient anatomy extensively for the thumb CMC, digits as well as carpal tunnel-as well as activity and analysis of patient's free time activities Patient report fingers close mostly numb when holding his phone or tablet Likes to work on the cars and around the house using tools and cutting sheet-metal  Paraffin done 8 minutes followed by soft tissue mobilization and massage opening carpal tunnel and metacarpals as well as webspace Patient was educated to do at home and wife to assist Rolling also over the foam roller flexors of the digits as well as forearm Or rolling over putty - teal opening palm Dry ,static 5 cm dry cupping on right forearm to and in the palm followed by soft tissue mobilization and stretches for wrist extension digit extension 10 reps Repeated 2-3 times Patient to do prayer stretch and reviewed with patient 10 reps 12 reps pain-free modified to the front As well as radial abduction of the thumb 12 reps   Patient did not had a positive Tinel at tenderness in the cubital tunnel reported more of his symptoms in the radial hand  than the ulnar hand No positive Tinel or Phalen's at the carpal tunnel but do have tenderness CMC tender and large and with opposition and lateral pinch and gripping thumb CMC collapses into the carpal tunnel observed multiple times in session  Reviewed task modification including built up handles and using larger joints like palms and forearm to pick up and carry objects  Reviewed with him holding phone and tablet.   Cont with CMC neoprene wrap to assist him Thumb CMC out of the carpal tunnel at nighttime but also when reading or on the phone or performing activities involving the thumb and  a lateral 3-point pinch or grip   Patient has a paraffin bath at home that his wife use.  Recommend for patient to do that 2-3 times a day prior to soft tissue mobilization and stretches REINFORCE again   This date green clothing pin 4 lbs for 2 point pinch  And 6 lbs for 3 point inch  12 reps  2-3 sets  And teal putty for ADD for thumb - weak but show 4-/5 And precision 2 point pinch  Pt do show some RD of 2nd digit-3-/5 strength- isometric and into putty - cannot take resistance Upgrade HEP      PATIENT EDUCATION: PATIENT EDUCATION: Education details: findings of eval and HEP  Person educated: Patient Education method: Explanation, Demonstration, Tactile cues, Verbal cues, and Handouts Education comprehension: verbalized understanding, returned demonstration, verbal cues required, and needs further education   GOALS: Goals reviewed with patient? Yes  SHORT TERM GOALS: Target date: 02/12/24  Pt will implement x3 task modifications for joint protection and minimize nerve compression.  Baseline: none Goal status: INITIAL  2.  Pt will Independently complete HEP for functional strengthening and ROM Baseline: none Goal status: INITIAL    LONG TERM GOALS: Target date: 03/18/24  Pt will increase R wrist flexion/extension AROM to WNL with pain less than 2/10. Baseline: flexion 55*, extension 35* Goal status: INITIAL  2.  Pt will increase R grip strength by 10# to open jars  Baseline: 59# Goal status: INITIAL  3.  Pt  will increase R lateral pinch strength by 10# to turn car key in ignition Baseline: 6# Goal status: INITIAL  4.  Pt will increase R 3 point pinch strength by 10# to hold ADL items  Baseline: 4# Goal status: INITIAL   ASSESSMENT:  CLINICAL IMPRESSION: Patient seen today for occupational therapy evaluation for loss of strength and dexterity in dominant R hand, concern for cubital tunnel syndrome. Noted deficits in dominant R grip strength, R wrist AROM,  and significant weakness in lateral and 3 pt pinch strength.  Patient with increased tightness in the flexors of the hand and forearm.  With arthritic changes in the digits and especially thumb CMC.  Observed in session  patient gripping and pinching thumb CMC drops into the carpal tunnel.  Done paraffin with patient for moist heat with focusing on soft tissue mobilization and stretches to open the hand and digit extension and wrist extension.  REINFORCE for pt to do at home - cont [facility] neoprene wrap to Thumb CMC out of the carpal tunnel with functional task. Pt show decrease strength mostly in 3 and 2 point pinch -and ADD of thumb and deviation of 2nd digit- upgrade HEP -  Pt demonstrates good understanding of task modification, plan to  built up handles for utensils/and tools and use larger joints like palms of forearm to pick up and carry objects. Patient can benefit from skilled OT services to increase dominant RUE functional strengthening and AROM as well as joint protection and modification education to return to prior level of function.    PERFORMANCE DEFICITS: in functional skills including ADLs, IADLs, ROM, strength, pain, flexibility, decreased knowledge of use of DME, and UE functional use,   and psychosocial skills including environmental adaptation and routines and behaviors.   IMPAIRMENTS: are limiting patient from ADLs, IADLs, rest and sleep, play, leisure, and social participation.   COMORBIDITIES: has no other co-morbidities that affects occupational performance. Patient will benefit from skilled OT to address above impairments and improve overall function.  MODIFICATION OR ASSISTANCE TO COMPLETE EVALUATION: No modification of tasks or assist necessary to complete an evaluation.  OT OCCUPATIONAL PROFILE AND HISTORY: Problem focused assessment: Including review of records relating to presenting problem.  CLINICAL DECISION MAKING: LOW - limited treatment options, no task modification  necessary  REHAB POTENTIAL: Good for goals  EVALUATION COMPLEXITY: Low        PLAN:  OT FREQUENCY: 1-2x/week  OT DURATION: 10 weeks  PLANNED INTERVENTIONS: 97535 self care/ADL training, 02889 therapeutic exercise, 97530 therapeutic activity, 97010 moist heat, and 97034 contrast bath    CONSULTED AND AGREED WITH PLAN OF CARE: Patient       Ancel Peters, OTR/L,CLT 01/28/2024, 7:51 PM

## 2024-02-04 ENCOUNTER — Ambulatory Visit: Admitting: Occupational Therapy

## 2024-02-04 DIAGNOSIS — M6281 Muscle weakness (generalized): Secondary | ICD-10-CM | POA: Insufficient documentation

## 2024-02-04 DIAGNOSIS — G5621 Lesion of ulnar nerve, right upper limb: Secondary | ICD-10-CM | POA: Diagnosis present

## 2024-02-04 NOTE — Therapy (Signed)
 OUTPATIENT OCCUPATIONAL THERAPY ORTHO TREATMENT Patient Name: Dillon Henry MRN: [id number] DOB:10-15-1950, 72 y.o., male Today's Date: 02/04/2024  PCP: Alda Carpen, MD REFERRING PROVIDER: Jackquline Barrack, MD  END OF SESSION:  OT End of Session - 02/04/24 1027     Visit Number 4    Number of Visits 14    Date for Recertification  03/18/24    OT Start Time 1028    OT Stop Time 1113    OT Time Calculation (min) 45 min    Activity Tolerance Patient tolerated treatment well    Behavior During Therapy [facility] for tasks assessed/performed          Past Medical History:  Diagnosis Date   Arthritis    osteoarthritis   Charcot's arthropathy    Diabetes mellitus 1994   Type II   Diabetic retinopathy (HCC)    Dysrhythmia    AF, history of an ablation in 2005   Hammer toe of left foot    Hyperlipidemia 05/1997   Hypertension 1995   Neuromuscular disorder (HCC)    polyneuropathies d/t diabetes   Osteomyelitis (HCC) 01/2020   left great toe   Skin ulcer (HCC) 01/2020   full thickness ulceration over IPJ.   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   Past Surgical History:  Procedure Laterality Date   AMPUTATION TOE Left 02/17/2020   Procedure: AMPUTATION TOE MPJ LEFT;  Surgeon: Neill Boas, DPM;  Location: [facility] ORS;  Service: Podiatry;  Laterality: Left;   ATRIAL ABLATION SURGERY  2005   For A Fib   COLONOSCOPY WITH PROPOFOL  N/A 12/23/2016   Procedure: COLONOSCOPY WITH PROPOFOL ;  Surgeon: Gaylyn Gladis PENNER, MD;  Location: [facility] ENDOSCOPY;  Service: Endoscopy;  Laterality: N/A;   COLONOSCOPY WITH PROPOFOL  N/A 03/09/2023   Procedure: COLONOSCOPY WITH PROPOFOL ;  Surgeon: Maryruth Ole DASEN, MD;  Location: [facility] ENDOSCOPY;  Service: Endoscopy;  Laterality: N/A;   DOBUTAMINE  STRESS ECHO  11/26/2005   Nonsust VTach HTSVE ECG NML   DOPPLER ECHOCARDIOGRAPHY  1999   Normal, EF 60%   DOPPLER ECHOCARDIOGRAPHY  01/11/2001   Mild CMC, LVH, TR MR, TR: ([place])   ETT   06/1998   Normal   Exercise tolerance test  05/1998   OK   POLYPECTOMY  03/09/2023   Procedure: POLYPECTOMY;  Surgeon: Maryruth Ole DASEN, MD;  Location: [facility] ENDOSCOPY;  Service: Endoscopy;;   Stress cardilite  12/1997   Normal   Stress cardiolite  03/02/2002   WNL  EF 70%   TOE AMPUTATION     TONSILLECTOMY AND ADENOIDECTOMY     TOTAL KNEE ARTHROPLASTY Right 10/09/2020   Procedure: TOTAL KNEE ARTHROPLASTY - [place] Amber to Assist;  Surgeon: Kathlynn Sharper, MD;  Location: [facility] ORS;  Service: Orthopedics;  Laterality: Right;   VASECTOMY     Patient Active Problem List   Diagnosis Date Noted   S/P TKR (total knee replacement) using cement, right 10/09/2020   HAMMER TOE 01/25/2010   Vitamin D deficiency 01/31/2009   ERECTILE DYSFUNCTION 10/27/2007   KNEE PAIN, RIGHT 06/15/2007   DIABETES MELLITUS, TYPE II 03/08/2007   HYPERLIPIDEMIA 03/08/2007   Essential hypertension 03/08/2007   FIBRILLATION, ATRIAL 03/05/2007    ONSET DATE: 12/23/23  REFERRING DIAG: Acute onset R hand weakness  THERAPY DIAG:  Cubital tunnel syndrome, right  Muscle weakness (generalized)  Rationale for Evaluation and Treatment: Rehabilitation  SUBJECTIVE:   SUBJECTIVE STATEMENT:  I can tell a difference.  I can tell my pinch is is getting  little stronger.  And I can play the piano again a little bit better than a month ago.  The doctor said my nerve conduction showed something about my elbow.  But my pinky is not numb.  And he was saying also about elbow brace.  But I do not want to go and buy a brace if I do not need it Pt accompanied by: self  PERTINENT HISTORY: Pt is referred for cubital tunnel syndrome with loss of dexterity in R hand.   PRECAUTIONS: None  RED FLAGS: None   WEIGHT BEARING RESTRICTIONS: No  PAIN:  Are you having pain? No pain reported  FALLS: Has patient fallen in last 6 months? No  LIVING ENVIRONMENT: Lives with: lives with their family Lives in: House/apartment  PLOF:  Independent plays guitar and piano, works on cars/machines  PATIENT GOALS: To regain function in R hand   NEXT MD VISIT: 02/08/24  OBJECTIVE:  Note: Objective measures were completed at Evaluation unless otherwise noted.  HAND DOMINANCE: Right  ADLs: Overall ADLs: requires modification to complete ADLs including handwriting, turn keys    UPPER EXTREMITY ROM:     Active ROM Right eval Left eval R 02/04/24  Shoulder flexion     Shoulder abduction     Shoulder adduction     Shoulder extension     Shoulder internal rotation     Shoulder external rotation     Elbow flexion     Elbow extension     Wrist flexion 55  65  Wrist extension 35  48  Wrist ulnar deviation     Wrist radial deviation 10  10  Wrist pronation     Wrist supination     (Blank rows = not tested)  Active ROM Right eval Left eval  Thumb MCP (0-60)    Thumb IP (0-80)    Thumb Radial abd/add (0-55)     Thumb Palmar abd/add (0-45)     Thumb Opposition to Small Finger     Index MCP (0-90)     Index PIP (0-100)     Index DIP (0-70)      Long MCP (0-90)      Long PIP (0-100)      Long DIP (0-70)      Ring MCP (0-90)      Ring PIP (0-100)      Ring DIP (0-70)      Little MCP (0-90)      Little PIP (0-100)      Little DIP (0-70)      (Blank rows = not tested)   UPPER EXTREMITY MMT:   B WFL    HAND FUNCTION: Grip strength: Right: 59 lbs; Left: 69 lbs, Lateral pinch: Right: 6 lbs, Left: 19 lbs, and 3 point pinch: Right: 4 lbs, Left: 16 lbs 01/28/24 Grip strength: Right: 54 lbs; Left: 69 lbs, Lateral pinch: Right: 8 lbs, Left: 19 lbs, and 3 point pinch: Right: 5 lbs, Left: 16 lbs and 2 point pinch R 3 lbs and L 9 lbs 02/04/24 Grip strength: Right: 54 lbs; Left: 69 lbs, Lateral pinch: Right: 10 lbs, Left: 19 lbs, and 3 point pinch: Right: 6 lbs, Left: 16 lbs and 2 point pinch R 4 lbs and L 9 lbs COORDINATION:  15 sec L and R hand to pick up and replace 5 beads  SENSATION: WFL  EDEMA:  none  COGNITION: Overall cognitive status: Within functional limits for tasks assessed  OBSERVATIONS: arthritis in R [facility] joint  TREATMENT DATE: 02/04/24                                                                                                                            Patient cont with increased tightness and stiffness especially in the flexors of the hand and forearm -unable to extend digits fully, thumb CMC adducted into the palm, decreased wrist extension but improving Patient with arthritic changes in the thumb as well as digits. Discussed and educated  patient  extensively about arthritis for the thumb CMC, digits that can cause some nerve involvement - as well as activity analysis of patient's free time activities Patient report fingers close mostly numb when holding his phone or tablet Likes to work on the cars and around the house using tools and cutting sheet-metal  Paraffin done 8 minutes followed by soft tissue mobilization and massage opening carpal tunnel and metacarpals as well as webspace Patient was educated to do at home and wife to assist Rolling also over the foam roller flexors of the digits as well as forearm Or rolling over putty - teal opening palm Dry ,static 5 cm dry cupping on right forearm to and in the palm followed by soft tissue mobilization and stretches for wrist extension digit extension 10 reps Repeated 2-3 times Patient to do prayer stretch and reviewed with patient 10 reps 12 reps pain-free modified to the front As well as radial abduction of the thumb 12 reps   Patient Negative Tinel and no  tenderness in the cubital tunnel reported more of his symptoms in the radial hand  than the ulnar hand No positive Tinel or Phalen's at the carpal tunnel but do have tenderness CMC tender enlarge and with opposition and lateral pinch and gripping thumb CMC collapses into the carpal tunnel observed multiple times in 2nd session but improving  Reviewed task  modification including built up handles and using larger joints like palms and forearm to pick up and carry objects  Reviewed with him holding phone and tablet.   Cont with CMC neoprene wrap to assist him Thumb CMC out of the carpal tunnel at nighttime but also when reading or on the phone or performing activities involving the thumb and a lateral 3-point pinch or grip   Patient has a paraffin bath at home that his wife use.  Recommend for patient to do that 2-3 times a day prior to soft tissue mobilization and stretches REINFORCE again   This date green clothing pin 4 lbs for 2 point pinch getting easier And 8 lbs for 3point and lat pinch 12 reps  2-3 sets  Done and pt to cont with teal putty for ADD for thumb - weak but show 4-/5 And precision 2 point pinch into putty Pt do show some RD of 2nd digit-3-/5 strength- isometric and into putty - And small ball of putty for ADD of all digits - Add to HEP  Also digits extention yellow resistance 12 reps  Upgrade HEP      PATIENT EDUCATION: PATIENT EDUCATION: Education details: findings of eval and HEP  Person educated: Patient Education method: Explanation, Demonstration, Tactile cues, Verbal cues, and Handouts Education comprehension: verbalized understanding, returned demonstration, verbal cues required, and needs further education   GOALS: Goals reviewed with patient? Yes  SHORT TERM GOALS: Target date: 02/12/24  Pt will implement x3 task modifications for joint protection and minimize nerve compression.  Baseline: none Goal status: INITIAL  2.  Pt will Independently complete HEP for functional strengthening and ROM Baseline: none Goal status: INITIAL    LONG TERM GOALS: Target date: 03/18/24  Pt will increase R wrist flexion/extension AROM to WNL with pain less than 2/10. Baseline: flexion 55*, extension 35* Goal status: INITIAL  2.  Pt will increase R grip strength by 10# to open jars  Baseline: 59# Goal status:  INITIAL  3.  Pt will increase R lateral pinch strength by 10# to turn car key in ignition Baseline: 6# Goal status: INITIAL  4.  Pt will increase R 3 point pinch strength by 10# to hold ADL items  Baseline: 4# Goal status: INITIAL   ASSESSMENT:  CLINICAL IMPRESSION: Patient seen for occupational therapy  for loss of strength and dexterity in dominant R hand, concern for cubital tunnel syndrome. Noted deficits in dominant R grip strength, R wrist AROM, and significant weakness in lateral and 3  and 2 pt pinch strength. As well as ADD and ABD of digits -  Patient with increased tightness in the flexors of the hand and forearm.  With arthritic changes in the digits and especially thumb CMC.  Observed in session  patient gripping and pinching thumb CMC drops into the carpal tunnel.  Done paraffin with patient for moist heat with focusing on soft tissue mobilization and stretches to open the hand and digit extension and wrist extension.  REINFORCE for pt to do at home - cont [facility] neoprene wrap to Thumb CMC out of the carpal tunnel with functional task. Pt show decrease strength mostly in 3 and 2 point pinch -and ADD of thumb and ADD /ABD of digits - upgrade HEP -  Pt demonstrates good understanding of task modification, plan to  built up handles for utensils/and tools and use larger joints like palms of forearm to pick up and carry objects.  Pt report increase ease in playing piano compare to month ago -and strength - Patient can benefit from skilled OT services to increase dominant RUE functional strengthening and AROM as well as joint protection and modification education to return to prior level of function.    PERFORMANCE DEFICITS: in functional skills including ADLs, IADLs, ROM, strength, pain, flexibility, decreased knowledge of use of DME, and UE functional use,   and psychosocial skills including environmental adaptation and routines and behaviors.   IMPAIRMENTS: are limiting patient from ADLs,  IADLs, rest and sleep, play, leisure, and social participation.   COMORBIDITIES: has no other co-morbidities that affects occupational performance. Patient will benefit from skilled OT to address above impairments and improve overall function.  MODIFICATION OR ASSISTANCE TO COMPLETE EVALUATION: No modification of tasks or assist necessary to complete an evaluation.  OT OCCUPATIONAL PROFILE AND HISTORY: Problem focused assessment: Including review of records relating to presenting problem.  CLINICAL DECISION MAKING: LOW - limited treatment options, no task modification necessary  REHAB POTENTIAL: Good for goals  EVALUATION COMPLEXITY: Low        PLAN:  OT FREQUENCY: 1-2x/week  OT DURATION: 10  weeks  PLANNED INTERVENTIONS: 97535 self care/ADL training, 02889 therapeutic exercise, 97530 therapeutic activity, 97010 moist heat, and 97034 contrast bath    CONSULTED AND AGREED WITH PLAN OF CARE: Patient       Ancel Peters, OTR/L,CLT 02/04/2024, 1:00 PM

## 2024-02-08 NOTE — Progress Notes (Signed)
 ORTHOPAEDIC SURGERY- CLINIC NOTE  Chief Complaint: Right hand dysfunction  History of Present Illness:  Dillon Henry is a 73 y.o. male who presents today for follow-up of the above.  I last saw the patient about 5-6 weeks ago.  At that time he was complaining of right hand dysfunction including issues with dexterity, as well as numbness to his fingertips.  He has a history of neuropathy and diabetes but previously the neuropathy primarily affected his feet.  He denies that any numbness or tingling wakes him from night.  At that time, he did exhibit some physical exam findings consistent with ulnar neuropathy with some weakness.  Electrodiagnostic studies were ordered.  I also placed a referral to Occupational Therapy to which the patient has attended.   Today, the patient reports that his symptoms are basically the same with maybe a mild improvement in terms of function and strength.,  However he reports he still has difficulty with abducting his fingers and his dexterity continues to be limited.  He has been attending therapy which he feels is mildly helpful.  He has been sleeping with a wrist brace, which she also feels is may be somewhat helpful.  Prior medical records were reviewed.    PMHx, PSurgHx, Fam Hx, Soc Hx, Meds, Allergies: Past Medical History:  Diagnosis Date  . Aortic atherosclerosis ()    on US  aorta (2022)  . Atrial fibrillation (CMS/HHS-HCC)   . Charcot's arthropathy   . Dermatophytosis of nail   . Diabetic retinopathy (CMS/HHS-HCC)   . Hyperlipidemia   . Hypertension   . Osteoarthritis   . Tubular adenoma of colon 12/23/2016  . Type 2 diabetes mellitus with peripheral neuropathy (CMS/HHS-HCC)     Past Surgical History:  Procedure Laterality Date  . ATRIAL ABLATION  2005  . CARDIAC SURGERY  2005   Ablation  . COLONOSCOPY  03/10/2011   redundant colon, diverticulosis in rectum, sigmoid, descending colon, transverse, ascending colon, and cecum  .  COLONOSCOPY  12/23/2016   Tubular adenoma of colon/Repeat 64yrs/MUS   (05/19/2022 Recall letter returned.awb)  . laser eye surgery  2019  . ARTHROPLASTY TOTAL KNEE Right 10/09/2020  . Colon @ [facility]  03/09/2023   Two small Ta's. Repeat colonoscopy in 5 years/[facility]  . HAMMER TOE REPAIR Right   . 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 Lilli  . TONSILLECTOMY    . VASECTOMY      Family History  Problem Relation Age of Onset  . Leukemia Father   . Colon cancer Sister   . Dementia Mother   . High blood pressure (Hypertension) Mother   . Heart disease Mother   . Diabetes Maternal Aunt   . Diabetes Maternal Uncle   . No Known Problems Brother   . Diabetes Maternal Grandmother   . Myocardial Infarction (Heart attack) Maternal Grandfather   . No Known Problems Paternal Grandmother   . No Known Problems Paternal Grandfather   . No Known Problems Sister   . No Known Problems Sister   . No Known Problems Daughter   . No Known Problems Daughter   . No Known Problems Daughter   . No Known Problems Son     Social History   Socioeconomic History  . Marital status: Married  . Number of children: 4  Tobacco Use  . Smoking status: Former    Current packs/day: 0.00    Average packs/day: 1 pack/day for 10.0 years (  10.0 ttl pk-yrs)    Types: Cigarettes    Start date: 03/31/1966    Quit date: 03/31/1976    Years since quitting: 47.8    Passive exposure: Past  . Smokeless tobacco: Never  Vaping Use  . Vaping status: Never Used  Substance and Sexual Activity  . Alcohol use: No    Alcohol/week: 0.0 standard drinks of alcohol  . Drug use: No  . Sexual activity: Defer  Social History Narrative   Marital status- Married   Lives with wife   Employment- [employer]   Supplements- Magnesium    Exercise hx- Walks 3 miles daily   Religious affliation- Christian   Social Drivers of Health   Financial Resource Strain: Low Risk  (12/28/2023)   Overall Financial  Resource Strain (CARDIA)   . Difficulty of Paying Living Expenses: Not hard at all  Food Insecurity: No Food Insecurity (12/28/2023)   Hunger Vital Sign   . Worried About Programme Researcher, Broadcasting/film/video in the Last Year: Never true   . Ran Out of Food in the Last Year: Never true  Transportation Needs: No Transportation Needs (12/28/2023)   PRAPARE - Transportation   . Lack of Transportation (Medical): No   . Lack of Transportation (Non-Medical): No  Housing Stability: Low Risk  (12/28/2023)   Housing Stability Vital Sign   . Unable to Pay for Housing in the Last Year: No   . Number of Times Moved in the Last Year: 0   . Homeless in the Last Year: No     Current Outpatient Medications  Medication Sig Dispense Refill  . acetaminophen  (TYLENOL ) 500 MG tablet Take 2 tablets (1,000 mg total) by mouth every 8 (eight) hours 30 tablet 0  . amLODIPine  (NORVASC ) 10 MG tablet TAKE 1 TABLET BY MOUTH EVERY DAY 90 tablet 1  . aspirin  81 MG EC tablet Take 81 mg by mouth once daily.    . blood glucose diagnostic (ONETOUCH VERIO TEST STRIPS) test strip USE 1 STRIP 3 TIMES A DAY AS INSTRUCTED 300 strip 5  . gabapentin  (NEURONTIN ) 300 MG capsule Take 1-2 capsules (300-600 mg total) by mouth once daily as needed 60 capsule 1  . glipiZIDE  (GLUCOTROL ) 10 MG tablet TAKE 1 TABLET (10 MG TOTAL) BY MOUTH DAILY WITH BREAKFAST. 30 tablet 11  . hydroCHLOROthiazide  (HYDRODIURIL ) 25 MG tablet TAKE 1 TABLET BY MOUTH EVERY DAY 100 tablet 1  . insulin  ASPART (NOVOLOG ) injection (concentration 100 units/mL) Inject up to 66 units daily as directed 20 mL 11  . insulin  GLARGINE (LANTUS U-100 INSULIN ) injection (concentration 100 units/mL) Inject up to 66 units daily as directed 20 mL 11  . losartan  (COZAAR ) 50 MG tablet Take 1.5 tablets (75 mg total) by mouth once daily 135 tablet 1  . magnesium  oxide (MAG-OX) 400 mg tablet Take 400 mg by mouth once daily.    . metFORMIN  (GLUCOPHAGE ) 1000 MG tablet TAKE 1 TABLET BY MOUTH TWICE A DAY 180  tablet 2  . pravastatin  (PRAVACHOL ) 20 MG tablet TAKE 1 TABLET BY MOUTH EVERY DAY AT NIGHT 100 tablet 1  . TURMERIC ORAL Take by mouth 2 (two) times daily    . zinc  50 mg Tab Take 1 tablet by mouth as directed Takes 1 tablet PO QOD PRN     No current facility-administered medications for this visit.    Allergies  Allergen Reactions  . Lisinopril Cough  . Pioglitazone Other (See Comments) and Unknown    REACTION: TOOTH PAIN  . Rosiglitazone  Other (See Comments)    REACTION: WEIGHT GAIN  . Rosiglitazone Maleate Other (See Comments) and Unknown    REACTION: WEIGHT GAIN    Review of Systems: A 10+ ROS was performed, reviewed, and the pertinent orthopaedic findings are documented in the HPI.  I have reviewed and agree with the ROS captured by the CMA.    Physical Exam: BP 134/82   Ht 188 cm (6' 2)   Wt (!) 109.3 kg (241 lb)   BMI 30.94 kg/m  General/Constitutional: No apparent distress: well-nourished and well developed. Eyes: Pupils equal, round with synchronous movement. Lymphatic: No palpable adenopathy. Respiratory: Patient has good chest rise and fall with inspiration and expiration.  All lung fields are clear to auscultation bilaterally.  There is no Rales, rhonchi or wheezes appreciated. Cardiovascular: Upon auscultation there is a regular rate and rhythm without any murmurs, rubs, gallops or heaves appreciated. Integumentary: No impressive skin lesions present, except as noted in detailed exam. Neuro/Psych: Normal mood and affect, oriented to person, place and time. Musculoskeletal: see exam below  Right upper Extremity:  Negative Tinel's, negative Phalen's, negative Durkan's at the wrist.  Positive Tinel's at the elbow, negative flexion compression test at the elbow.  5 out of 5 thumb palmar abduction strength.  He has 3 out of 5 finger abduction strength.  There is wasting the first dorsal interosseous muscle compared to the contralateral side.  He has 5 out of 5  ulnar-sided FDP strength.  He has a mildly positive Wartenberg's sign.  Positive Froment's sign.  No evidence of ulnar clawing.  Subjectively decreased sensation to light touch about the ulnar-sided digits.  Sharp-dull discrimination was inconsistent to all of the digits of the right hand.  There is also some inconsistency with sharp-dull discrimination to the digits of the left hand but less so.  Digits are warm and well-perfused  Imaging and Results: Radiographs of the right hand that were obtained on 12/11/2023 were personally reviewed.  They demonstrate some Eaton stage III thumb CMC arthritis.  There is also significant radiocarpal arthritis involving both the scaphoid and lunate fossa.  It appears that he had a possible old distal radius fracture given the pattern of severe arthritic changes.  There is also significant arthrosis involving the DRUJ joint.  Additionally he also appears to have advanced pattern of SLAC wrist.  He has moderate to severe arthritic changes of the index finger, middle finger, ring finger MCP joints.  He has moderate to severe arthritic changes to the PIP joints of the index, middle, ring, small fingers.   Assessment & Plan:  Right cubital tunnel, carpal tunnel syndrome - The results of the electrodiagnostic study that he obtained on 01/27/2024 are not yet final - I have reached out to the performing provider and and am waiting to hear back to get a final read - Given his physical exam and the results from the nerve study that I can review, he would likely benefit from a carpal and cubital tunnel release surgery - We discussed these surgeries as well as the risk, benefits, alternatives including but not limited to infection, injury to nearby structures, stiffness, pain, incomplete recovery/relief of symptoms, recurrence of symptoms - Once I have the final report of the electrodiagnostic study, I will reach out to the patient with my final recommendations and likely schedule  him for surgery  Right first CMC, pan carpal, radial scaphoid arthritis - Radiographically advanced arthritic changes to the wrist - Patient is fairly asymptomatic with regards  to this - We will continue to monitor and treat on an as needed basis   Jackquline CANDIE Barrack, MD [facility] Orthopaedics and Sports Medicine [address] [place], [place] [address] Phone: [phone]  This note was generated in part with voice recognition software; please excuse any typographical errors that were not detected and corrected.

## 2024-02-11 ENCOUNTER — Ambulatory Visit: Admitting: Occupational Therapy

## 2024-02-11 DIAGNOSIS — G5621 Lesion of ulnar nerve, right upper limb: Secondary | ICD-10-CM

## 2024-02-11 DIAGNOSIS — M6281 Muscle weakness (generalized): Secondary | ICD-10-CM

## 2024-02-11 NOTE — Therapy (Signed)
 OUTPATIENT OCCUPATIONAL THERAPY ORTHO TREATMENT Patient Name: Dillon Henry MRN: [id number] DOB:1950-11-27, 73 y.o., male Today's Date: 02/11/2024  PCP: Alda Carpen, MD REFERRING PROVIDER: Jackquline Barrack, MD  END OF SESSION:  OT End of Session - 02/11/24 0946     Visit Number 5    Number of Visits 14    Date for Recertification  03/18/24    OT Start Time 0946    OT Stop Time 1025    OT Time Calculation (min) 39 min    Activity Tolerance Patient tolerated treatment well    Behavior During Therapy [facility] for tasks assessed/performed          Past Medical History:  Diagnosis Date   Arthritis    osteoarthritis   Charcot's arthropathy    Diabetes mellitus 1994   Type II   Diabetic retinopathy (HCC)    Dysrhythmia    AF, history of an ablation in 2005   Hammer toe of left foot    Hyperlipidemia 05/1997   Hypertension 1995   Neuromuscular disorder (HCC)    polyneuropathies d/t diabetes   Osteomyelitis (HCC) 01/2020   left great toe   Skin ulcer (HCC) 01/2020   full thickness ulceration over IPJ.   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   Past Surgical History:  Procedure Laterality Date   AMPUTATION TOE Left 02/17/2020   Procedure: AMPUTATION TOE MPJ LEFT;  Surgeon: Neill Boas, DPM;  Location: [facility] ORS;  Service: Podiatry;  Laterality: Left;   ATRIAL ABLATION SURGERY  2005   For A Fib   COLONOSCOPY WITH PROPOFOL  N/A 12/23/2016   Procedure: COLONOSCOPY WITH PROPOFOL ;  Surgeon: Gaylyn Gladis PENNER, MD;  Location: [facility] ENDOSCOPY;  Service: Endoscopy;  Laterality: N/A;   COLONOSCOPY WITH PROPOFOL  N/A 03/09/2023   Procedure: COLONOSCOPY WITH PROPOFOL ;  Surgeon: Maryruth Ole DASEN, MD;  Location: [facility] ENDOSCOPY;  Service: Endoscopy;  Laterality: N/A;   DOBUTAMINE  STRESS ECHO  11/26/2005   Nonsust VTach HTSVE ECG NML   DOPPLER ECHOCARDIOGRAPHY  1999   Normal, EF 60%   DOPPLER ECHOCARDIOGRAPHY  01/11/2001   Mild CMC, LVH, TR MR, TR: ([place])   ETT   06/1998   Normal   Exercise tolerance test  05/1998   OK   POLYPECTOMY  03/09/2023   Procedure: POLYPECTOMY;  Surgeon: Maryruth Ole DASEN, MD;  Location: [facility] ENDOSCOPY;  Service: Endoscopy;;   Stress cardilite  12/1997   Normal   Stress cardiolite  03/02/2002   WNL  EF 70%   TOE AMPUTATION     TONSILLECTOMY AND ADENOIDECTOMY     TOTAL KNEE ARTHROPLASTY Right 10/09/2020   Procedure: TOTAL KNEE ARTHROPLASTY - [place] Amber to Assist;  Surgeon: Kathlynn Sharper, MD;  Location: [facility] ORS;  Service: Orthopedics;  Laterality: Right;   VASECTOMY     Patient Active Problem List   Diagnosis Date Noted   S/P TKR (total knee replacement) using cement, right 10/09/2020   HAMMER TOE 01/25/2010   Vitamin D deficiency 01/31/2009   ERECTILE DYSFUNCTION 10/27/2007   KNEE PAIN, RIGHT 06/15/2007   DIABETES MELLITUS, TYPE II 03/08/2007   HYPERLIPIDEMIA 03/08/2007   Essential hypertension 03/08/2007   FIBRILLATION, ATRIAL 03/05/2007    ONSET DATE: 12/23/23  REFERRING DIAG: Acute onset R hand weakness  THERAPY DIAG:  Cubital tunnel syndrome, right  Muscle weakness (generalized)  Rationale for Evaluation and Treatment: Rehabilitation  SUBJECTIVE:   SUBJECTIVE STATEMENT: I did see Dr Barrack - and got my nerve conduction test -show mod Carpal  tunnel and Cubital tunnel - I think - so I probably need to do it - do you know if I can wait or does it make difference  Pt accompanied by: self  PERTINENT HISTORY: Pt is referred for cubital tunnel syndrome with loss of dexterity in R hand.   PRECAUTIONS: None  RED FLAGS: None   WEIGHT BEARING RESTRICTIONS: No  PAIN:  Are you having pain? No pain reported  FALLS: Has patient fallen in last 6 months? No  LIVING ENVIRONMENT: Lives with: lives with their family Lives in: House/apartment  PLOF: Independent plays guitar and piano, works on cars/machines  PATIENT GOALS: To regain function in R hand   NEXT MD VISIT: 02/08/24  OBJECTIVE:   Note: Objective measures were completed at Evaluation unless otherwise noted.  HAND DOMINANCE: Right  ADLs: Overall ADLs: requires modification to complete ADLs including handwriting, turn keys    UPPER EXTREMITY ROM:     Active ROM Right eval Left eval R 02/04/24  Shoulder flexion     Shoulder abduction     Shoulder adduction     Shoulder extension     Shoulder internal rotation     Shoulder external rotation     Elbow flexion     Elbow extension     Wrist flexion 55  65  Wrist extension 35  48  Wrist ulnar deviation     Wrist radial deviation 10  10  Wrist pronation     Wrist supination     (Blank rows = not tested)  Active ROM Right eval Left eval  Thumb MCP (0-60)    Thumb IP (0-80)    Thumb Radial abd/add (0-55)     Thumb Palmar abd/add (0-45)     Thumb Opposition to Small Finger     Index MCP (0-90)     Index PIP (0-100)     Index DIP (0-70)      Long MCP (0-90)      Long PIP (0-100)      Long DIP (0-70)      Ring MCP (0-90)      Ring PIP (0-100)      Ring DIP (0-70)      Little MCP (0-90)      Little PIP (0-100)      Little DIP (0-70)      (Blank rows = not tested)   UPPER EXTREMITY MMT:   B WFL    HAND FUNCTION: Grip strength: Right: 59 lbs; Left: 69 lbs, Lateral pinch: Right: 6 lbs, Left: 19 lbs, and 3 point pinch: Right: 4 lbs, Left: 16 lbs 01/28/24 Grip strength: Right: 54 lbs; Left: 69 lbs, Lateral pinch: Right: 8 lbs, Left: 19 lbs, and 3 point pinch: Right: 5 lbs, Left: 16 lbs and 2 point pinch R 3 lbs and L 9 lbs 02/04/24 Grip strength: Right: 54 lbs; Left: 69 lbs, Lateral pinch: Right: 10 lbs, Left: 19 lbs, and 3 point pinch: Right: 6 lbs, Left: 16 lbs and 2 point pinch R 4 lbs and L 9 lbs COORDINATION:  15 sec L and R hand to pick up and replace 5 beads  SENSATION: WFL  EDEMA: none  COGNITION: Overall cognitive status: Within functional limits for tasks assessed  OBSERVATIONS: arthritis in R [facility] joint   TREATMENT DATE:  02/11/24  Patient cont with increased tightness and stiffness especially in the flexors of the hand and forearm -unable to extend digits fully, thumb CMC adducted into the palm, decreased wrist extension but improving Patient with arthritic changes in the thumb as well as digits. Patient report fingers close mostly numb when holding his phone or tablet Likes to work on the cars and around the house using tools and cutting sheet-metal  Paraffin done 8 minutes followed by soft tissue mobilization and massage opening carpal tunnel and metacarpals as well as webspace Patient was educated to do at home and wife to assist Rolling also over the foam roller flexors of the digits as well as forearm Or rolling over putty - teal opening palm Dry ,static 5 cm dry cupping on right forearm to and in the palm followed by soft tissue mobilization and stretches for wrist extension digit extension 10 reps Repeated 2-3 times Patient to do prayer stretch and reviewed with patient 10 reps 12 reps pain-free modified to the front Can also do table slides for composite extention  20 reps pain free As well as radial abduction of the thumb 12 reps   Reviewed task modification including built up handles and using larger joints like palms and forearm to pick up and carry objects  Reviewed with him holding phone and tablet.   Cont with CMC neoprene wrap to assist him Thumb CMC out of the carpal tunnel at nighttime but also when reading or on the phone or performing activities involving the thumb and a lateral 3-point pinch or grip   Patient has a paraffin bath at home that his wife use.  Recommend for patient to do that 2-3 times a day prior to soft tissue mobilization and stretches REINFORCE again   Done and pt to cont with teal putty for ADD for thumb - weak but show 4-/5 And precision 2 point  pinch into putty Pt do show some RD of 2nd digit-3-/5 strength- isometric and into putty - And small ball of putty for ADD of all digits - Cont with HEP until surgery       PATIENT EDUCATION: PATIENT EDUCATION: Education details: findings of eval and HEP  Person educated: Patient Education method: Explanation, Demonstration, Tactile cues, Verbal cues, and Handouts Education comprehension: verbalized understanding, returned demonstration, verbal cues required, and needs further education   GOALS: Goals reviewed with patient? Yes  SHORT TERM GOALS: Target date: 02/12/24  Pt will implement x3 task modifications for joint protection and minimize nerve compression.  Baseline: none Goal status: INITIAL  2.  Pt will Independently complete HEP for functional strengthening and ROM Baseline: none Goal status: INITIAL    LONG TERM GOALS: Target date: 03/18/24  Pt will increase R wrist flexion/extension AROM to WNL with pain less than 2/10. Baseline: flexion 55*, extension 35* Goal status: INITIAL  2.  Pt will increase R grip strength by 10# to open jars  Baseline: 59# Goal status: INITIAL  3.  Pt will increase R lateral pinch strength by 10# to turn car key in ignition Baseline: 6# Goal status: INITIAL  4.  Pt will increase R 3 point pinch strength by 10# to hold ADL items  Baseline: 4# Goal status: INITIAL   ASSESSMENT:  CLINICAL IMPRESSION: Patient seen for occupational therapy  for loss of strength and dexterity in dominant R hand, concern for cubital tunnel syndrome. Noted deficits in dominant R grip strength, R wrist AROM, and significant weakness in lateral and 3  and 2 pt  pinch strength. As well as ADD and ABD of digits -  Patient with increased tightness in the flexors of the hand and forearm.  With arthritic changes in the digits and especially thumb CMC.  Observed in session  patient gripping and pinching thumb CMC drops into the carpal tunnel.  Done paraffin with  patient for moist heat with focusing on soft tissue mobilization and stretches to open the hand and digit extension and wrist extension.  REINFORCE for pt to do at home - cont [facility] neoprene wrap to Thumb CMC out of the carpal tunnel with functional task. Pt show decrease strength mostly in 3 and 2 point pinch -and ADD of thumb and ADD /ABD of digits -- Recommend for pt to follow surgeon instruction for surgery -and cont HEP until surgery -  Patient can benefit from skilled OT services to increase dominant RUE functional strengthening and AROM as well as joint protection and modification education to return to prior level of function.    PERFORMANCE DEFICITS: in functional skills including ADLs, IADLs, ROM, strength, pain, flexibility, decreased knowledge of use of DME, and UE functional use,   and psychosocial skills including environmental adaptation and routines and behaviors.   IMPAIRMENTS: are limiting patient from ADLs, IADLs, rest and sleep, play, leisure, and social participation.   COMORBIDITIES: has no other co-morbidities that affects occupational performance. Patient will benefit from skilled OT to address above impairments and improve overall function.  MODIFICATION OR ASSISTANCE TO COMPLETE EVALUATION: No modification of tasks or assist necessary to complete an evaluation.  OT OCCUPATIONAL PROFILE AND HISTORY: Problem focused assessment: Including review of records relating to presenting problem.  CLINICAL DECISION MAKING: LOW - limited treatment options, no task modification necessary  REHAB POTENTIAL: Good for goals  EVALUATION COMPLEXITY: Low        PLAN:  OT FREQUENCY: 1-2x/week  OT DURATION: 10 weeks  PLANNED INTERVENTIONS: 97535 self care/ADL training, 02889 therapeutic exercise, 97530 therapeutic activity, 97010 moist heat, and 97034 contrast bath    CONSULTED AND AGREED WITH PLAN OF CARE: Patient       Ancel Peters, OTR/L,CLT 02/11/2024, 4:34 PM

## 2024-02-14 ENCOUNTER — Inpatient Hospital Stay
Admission: EM | Admit: 2024-02-14 | Discharge: 2024-02-16 | DRG: 378 | Disposition: A | Discharge status: Home / Self Care | Attending: Family Medicine | Admitting: Family Medicine

## 2024-02-14 ENCOUNTER — Encounter: Payer: Self-pay | Admitting: Emergency Medicine

## 2024-02-14 ENCOUNTER — Other Ambulatory Visit: Payer: Self-pay

## 2024-02-14 DIAGNOSIS — R1310 Dysphagia, unspecified: Secondary | ICD-10-CM | POA: Diagnosis present

## 2024-02-14 DIAGNOSIS — K922 Gastrointestinal hemorrhage, unspecified: Secondary | ICD-10-CM | POA: Diagnosis not present

## 2024-02-14 DIAGNOSIS — E11610 Type 2 diabetes mellitus with diabetic neuropathic arthropathy: Secondary | ICD-10-CM | POA: Diagnosis present

## 2024-02-14 DIAGNOSIS — M1990 Unspecified osteoarthritis, unspecified site: Secondary | ICD-10-CM | POA: Diagnosis present

## 2024-02-14 DIAGNOSIS — N400 Enlarged prostate without lower urinary tract symptoms: Secondary | ICD-10-CM | POA: Diagnosis present

## 2024-02-14 DIAGNOSIS — I771 Stricture of artery: Secondary | ICD-10-CM | POA: Diagnosis present

## 2024-02-14 DIAGNOSIS — Z800 Family history of malignant neoplasm of digestive organs: Secondary | ICD-10-CM

## 2024-02-14 DIAGNOSIS — Z7982 Long term (current) use of aspirin: Secondary | ICD-10-CM

## 2024-02-14 DIAGNOSIS — D62 Acute posthemorrhagic anemia: Secondary | ICD-10-CM | POA: Diagnosis present

## 2024-02-14 DIAGNOSIS — K2980 Duodenitis without bleeding: Secondary | ICD-10-CM | POA: Diagnosis present

## 2024-02-14 DIAGNOSIS — D120 Benign neoplasm of cecum: Secondary | ICD-10-CM | POA: Diagnosis present

## 2024-02-14 DIAGNOSIS — K760 Fatty (change of) liver, not elsewhere classified: Secondary | ICD-10-CM | POA: Diagnosis present

## 2024-02-14 DIAGNOSIS — K449 Diaphragmatic hernia without obstruction or gangrene: Secondary | ICD-10-CM | POA: Diagnosis present

## 2024-02-14 DIAGNOSIS — H905 Unspecified sensorineural hearing loss: Secondary | ICD-10-CM | POA: Diagnosis present

## 2024-02-14 DIAGNOSIS — E11319 Type 2 diabetes mellitus with unspecified diabetic retinopathy without macular edema: Secondary | ICD-10-CM | POA: Diagnosis present

## 2024-02-14 DIAGNOSIS — Z806 Family history of leukemia: Secondary | ICD-10-CM

## 2024-02-14 DIAGNOSIS — K921 Melena: Secondary | ICD-10-CM

## 2024-02-14 DIAGNOSIS — Z833 Family history of diabetes mellitus: Secondary | ICD-10-CM

## 2024-02-14 DIAGNOSIS — Z7984 Long term (current) use of oral hypoglycemic drugs: Secondary | ICD-10-CM

## 2024-02-14 DIAGNOSIS — E785 Hyperlipidemia, unspecified: Secondary | ICD-10-CM | POA: Diagnosis present

## 2024-02-14 DIAGNOSIS — I10 Essential (primary) hypertension: Secondary | ICD-10-CM | POA: Diagnosis present

## 2024-02-14 DIAGNOSIS — K5731 Diverticulosis of large intestine without perforation or abscess with bleeding: Principal | ICD-10-CM | POA: Diagnosis present

## 2024-02-14 DIAGNOSIS — Z96651 Presence of right artificial knee joint: Secondary | ICD-10-CM | POA: Diagnosis present

## 2024-02-14 DIAGNOSIS — E1142 Type 2 diabetes mellitus with diabetic polyneuropathy: Secondary | ICD-10-CM | POA: Diagnosis present

## 2024-02-14 DIAGNOSIS — Z794 Long term (current) use of insulin: Secondary | ICD-10-CM

## 2024-02-14 DIAGNOSIS — Z89422 Acquired absence of other left toe(s): Secondary | ICD-10-CM

## 2024-02-14 DIAGNOSIS — Z6829 Body mass index (BMI) 29.0-29.9, adult: Secondary | ICD-10-CM

## 2024-02-14 DIAGNOSIS — K635 Polyp of colon: Secondary | ICD-10-CM

## 2024-02-14 DIAGNOSIS — Z87891 Personal history of nicotine dependence: Secondary | ICD-10-CM

## 2024-02-14 DIAGNOSIS — I701 Atherosclerosis of renal artery: Secondary | ICD-10-CM | POA: Diagnosis present

## 2024-02-14 DIAGNOSIS — Z79899 Other long term (current) drug therapy: Secondary | ICD-10-CM

## 2024-02-14 DIAGNOSIS — Z823 Family history of stroke: Secondary | ICD-10-CM

## 2024-02-14 DIAGNOSIS — E663 Overweight: Secondary | ICD-10-CM | POA: Diagnosis present

## 2024-02-14 DIAGNOSIS — Z888 Allergy status to other drugs, medicaments and biological substances status: Secondary | ICD-10-CM

## 2024-02-14 LAB — CBC WITH DIFFERENTIAL/PLATELET
Abs Immature Granulocytes: 0.06 K/uL (ref 0.00–0.07)
Basophils Absolute: 0.1 K/uL (ref 0.0–0.1)
Basophils Relative: 1 %
Eosinophils Absolute: 0.2 K/uL (ref 0.0–0.5)
Eosinophils Relative: 2 %
HCT: 37.9 % — ABNORMAL LOW (ref 39.0–52.0)
Hemoglobin: 12.6 g/dL — ABNORMAL LOW (ref 13.0–17.0)
Immature Granulocytes: 1 %
Lymphocytes Relative: 44 %
Lymphs Abs: 4.5 K/uL — ABNORMAL HIGH (ref 0.7–4.0)
MCH: 29.9 pg (ref 26.0–34.0)
MCHC: 33.2 g/dL (ref 30.0–36.0)
MCV: 89.8 fL (ref 80.0–100.0)
Monocytes Absolute: 0.8 K/uL (ref 0.1–1.0)
Monocytes Relative: 8 %
Neutro Abs: 4.6 K/uL (ref 1.7–7.7)
Neutrophils Relative %: 44 %
Platelets: 255 K/uL (ref 150–400)
RBC: 4.22 MIL/uL (ref 4.22–5.81)
RDW: 13.4 % (ref 11.5–15.5)
WBC: 10.3 K/uL (ref 4.0–10.5)
nRBC: 0 % (ref 0.0–0.2)

## 2024-02-14 LAB — COMPREHENSIVE METABOLIC PANEL WITH GFR
ALT: 21 U/L (ref 0–44)
AST: 22 U/L (ref 15–41)
Albumin: 4.2 g/dL (ref 3.5–5.0)
Alkaline Phosphatase: 58 U/L (ref 38–126)
Anion gap: 15 (ref 5–15)
BUN: 17 mg/dL (ref 8–23)
CO2: 23 mmol/L (ref 22–32)
Calcium: 9.2 mg/dL (ref 8.9–10.3)
Chloride: 97 mmol/L — ABNORMAL LOW (ref 98–111)
Creatinine, Ser: 0.83 mg/dL (ref 0.61–1.24)
GFR, Estimated: >60 mL/min (ref >60)
Glucose, Bld: 151 mg/dL — ABNORMAL HIGH (ref 70–99)
Potassium: 4 mmol/L (ref 3.5–5.1)
Sodium: 135 mmol/L (ref 135–145)
Total Bilirubin: 0.6 mg/dL (ref 0.0–1.2)
Total Protein: 6.5 g/dL (ref 6.5–8.1)

## 2024-02-14 NOTE — ED Provider Notes (Signed)
 [facility] Provider Note    Event Date/Time   First MD Initiated Contact with Patient 02/14/24 2159     (approximate)   History   Rectal Bleeding   HPI  Dillon Henry is a 73 y.o. male with a history of type 2 diabetes, hypertension, hyperlipidemia, osteoarthritis, who presents with blood in the stool, acute onset this evening.  The patient thinks that he had a normal bowel movement earlier today.  Subsequently he had a bowel movement with dark red blood.  He denies black or tarry material in the stool.  He has no abdominal or rectal pain.  He does not feel dizzy or lightheaded.  He denies any prior history of GI bleeding.  He does have a history of diverticulosis.  He has no nausea or vomiting.  I reviewed the past medical records.  The patient's most recent visit with family medicine was on 9/12 with right hand pain.  He was seen on 11/10 by hand surgery for follow-up of right hand dysfunction.  He had a surveillance colonoscopy in 2024 which revealed a few polyps and diverticula.  Physical Exam   Triage Vital Signs: ED Triage Vitals  Encounter Vitals Group     BP 02/14/24 2141 (!) 160/87     Girls Systolic BP Percentile --      Girls Diastolic BP Percentile --      Boys Systolic BP Percentile --      Boys Diastolic BP Percentile --      Pulse Rate 02/14/24 2141 100     Resp 02/14/24 2141 16     Temp 02/14/24 2141 98.1 F (36.7 C)     Temp Source 02/14/24 2141 Oral     SpO2 02/14/24 2141 99 %     Weight 02/14/24 2140 230 lb (104.3 kg)     Height 02/14/24 2140 6' 2 (1.88 m)     Head Circumference --      Peak Flow --      Pain Score 02/14/24 2139 0     Pain Loc --      Pain Education --      Exclude from Growth Chart --     Most recent vital signs: Vitals:   02/14/24 2141  BP: (!) 160/87  Pulse: 100  Resp: 16  Temp: 98.1 F (36.7 C)  SpO2: 99%     General: Awake, no distress.  CV:  Good peripheral perfusion.  Resp:  Normal  effort.  Abd:  Soft and nontender.  No distention.  Other:  No conjunctival pallor.   ED Results / Procedures / Treatments   Labs (all labs ordered are listed, but only abnormal results are displayed) Labs Reviewed  CBC WITH DIFFERENTIAL/PLATELET - Abnormal; Notable for the following components:      Result Value   Hemoglobin 12.6 (*)    HCT 37.9 (*)    Lymphs Abs 4.5 (*)    All other components within normal limits  COMPREHENSIVE METABOLIC PANEL WITH GFR - Abnormal; Notable for the following components:   Chloride 97 (*)    Glucose, Bld 151 (*)    All other components within normal limits     EKG     RADIOLOGY    PROCEDURES:  Critical Care performed: No  Procedures   MEDICATIONS ORDERED IN ED: Medications - No data to display   IMPRESSION / MDM / ASSESSMENT AND PLAN / ED COURSE  I reviewed the triage vital signs and the  nursing notes.  73 year old male with PMH as noted above presents with blood in the stool acute onset this evening.  On exam he is overall well-appearing.  Vital signs are normal except for hypertension.  He had a bowel movement in the ED.  The color is dark red.  It is not consistent with melena.  Differential diagnosis includes, but is not limited to, lower GI bleed, possible diverticulosis versus internal hemorrhoid.  There is no evidence of colitis or other acute infectious etiology.  We will obtain basic labs and reassess.  There is no indication for imaging at this time.  The patient would strongly prefer not to be admitted if at all possible.  He agrees to wait for a 4-hour repeat CBC.  Patient's presentation is most consistent with acute presentation with potential threat to life or bodily function.  The patient is on the cardiac monitor to evaluate for evidence of arrhythmia and/or significant heart rate changes.  ----------------------------------------- 11:07 PM on 02/14/2024 -----------------------------------------  CMP shows no  acute findings.  CBC shows a hemoglobin of 12.6 which is consistent with the patient's baseline.  On reassessment he continues to feel well.  I have signed him out to the oncoming ED physician Dr. Clarine.     FINAL CLINICAL IMPRESSION(S) / ED DIAGNOSES   Final diagnoses:  Lower GI bleed     Rx / DC Orders   ED Discharge Orders     None        Note:  This document was prepared using Dragon voice recognition software and may include unintentional dictation errors.    Jacolyn Pae, MD 02/14/24 2308

## 2024-02-14 NOTE — ED Triage Notes (Signed)
 Patient to ER from home with report of blood in toilet after having bowel movement. Denies any hemorrhoids, ASA only. No history of bleeding before. Feels like he still needs to go.

## 2024-02-14 NOTE — ED Provider Notes (Signed)
  Physical Exam  BP 133/78 (BP Location: Right Arm)   Pulse 86   Temp 98.3 F (36.8 C) (Oral)   Resp 17   Ht 6' 2 (1.88 m)   Wt 104.3 kg   SpO2 98%   BMI 29.53 kg/m   Physical Exam  Procedures  Procedures  ED Course / MDM   Clinical Course as of 02/15/24 0531  Sun Feb 14, 2024  2313 S/o from Dr. Jacolyn - bloody stools today, no melena, hgb stable, VSS stable, prefers discharge home if possible  To do: - f/u rpt cbc, if stable plan for DC home [MM]  Mon Feb 15, 2024  0054 Notified by patient's nurse that he has had several episodes of bloody stool here in emergency department, no melena.  Remains hemodynamically stable.  On my personal evaluation no tenderness to deep palpation throughout abdomen.  He is on aspirin , no blood thinners.  No recent antibiotics.  History of diverticulosis, no known history of diverticulitis.  Plan for repeat CBC in about 1 hour. [MM]  0304 Repeat CBC with hemoglobin drop of about 2 point  Patient reevaluated, no significant bleeding appreciated currently, amenable to admission  Will get CTA GI bleed protocol and plan for admission, stable for GI eval in the morning from my perspective [MM]  [phone] CTA with no obvious extravasation on my interpretation, formal read pending  Per chart review, last colonoscopy in 2024, diverticulosis, some polyps in the ascending colon, otherwise unremarkable.  Will add coags, type and screen and plan for admission.  Hospitalist consult order placed. [MM]  0358 CTA AP: IMPRESSION: 1. No active GI bleeding. 2. 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 3. 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 Okay 4. 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 5. Mild hepatic prominence and steatosis. 6. Prostatomegaly. 7. Sigmoid  diverticulosis without evidence of diverticulitis.   [MM]    Clinical Course User Index [MM] Clarine Ozell LABOR, MD   Medical Decision Making Amount and/or Complexity of Data Reviewed Labs: ordered. Radiology: ordered.  Risk Prescription drug management. Decision regarding hospitalization.          Clarine Ozell LABOR, MD 02/15/24 [id number]

## 2024-02-15 ENCOUNTER — Emergency Department

## 2024-02-15 ENCOUNTER — Encounter (INDEPENDENT_AMBULATORY_CARE_PROVIDER_SITE_OTHER): Admitting: Ophthalmology

## 2024-02-15 DIAGNOSIS — Z823 Family history of stroke: Secondary | ICD-10-CM | POA: Diagnosis not present

## 2024-02-15 DIAGNOSIS — I10 Essential (primary) hypertension: Secondary | ICD-10-CM | POA: Diagnosis present

## 2024-02-15 DIAGNOSIS — Z96651 Presence of right artificial knee joint: Secondary | ICD-10-CM | POA: Diagnosis present

## 2024-02-15 DIAGNOSIS — E1142 Type 2 diabetes mellitus with diabetic polyneuropathy: Secondary | ICD-10-CM

## 2024-02-15 DIAGNOSIS — N400 Enlarged prostate without lower urinary tract symptoms: Secondary | ICD-10-CM | POA: Diagnosis present

## 2024-02-15 DIAGNOSIS — D62 Acute posthemorrhagic anemia: Secondary | ICD-10-CM

## 2024-02-15 DIAGNOSIS — K921 Melena: Secondary | ICD-10-CM

## 2024-02-15 DIAGNOSIS — R1310 Dysphagia, unspecified: Secondary | ICD-10-CM | POA: Diagnosis present

## 2024-02-15 DIAGNOSIS — Z794 Long term (current) use of insulin: Secondary | ICD-10-CM | POA: Diagnosis not present

## 2024-02-15 DIAGNOSIS — Z7984 Long term (current) use of oral hypoglycemic drugs: Secondary | ICD-10-CM | POA: Diagnosis not present

## 2024-02-15 DIAGNOSIS — K760 Fatty (change of) liver, not elsewhere classified: Secondary | ICD-10-CM | POA: Diagnosis present

## 2024-02-15 DIAGNOSIS — Z87891 Personal history of nicotine dependence: Secondary | ICD-10-CM | POA: Diagnosis not present

## 2024-02-15 DIAGNOSIS — E785 Hyperlipidemia, unspecified: Secondary | ICD-10-CM | POA: Insufficient documentation

## 2024-02-15 DIAGNOSIS — Z7982 Long term (current) use of aspirin: Secondary | ICD-10-CM | POA: Diagnosis not present

## 2024-02-15 DIAGNOSIS — K922 Gastrointestinal hemorrhage, unspecified: Secondary | ICD-10-CM | POA: Diagnosis present

## 2024-02-15 DIAGNOSIS — Z833 Family history of diabetes mellitus: Secondary | ICD-10-CM | POA: Diagnosis not present

## 2024-02-15 DIAGNOSIS — E11319 Type 2 diabetes mellitus with unspecified diabetic retinopathy without macular edema: Secondary | ICD-10-CM | POA: Diagnosis present

## 2024-02-15 DIAGNOSIS — I771 Stricture of artery: Secondary | ICD-10-CM | POA: Diagnosis present

## 2024-02-15 DIAGNOSIS — K5731 Diverticulosis of large intestine without perforation or abscess with bleeding: Secondary | ICD-10-CM | POA: Diagnosis present

## 2024-02-15 DIAGNOSIS — Z89422 Acquired absence of other left toe(s): Secondary | ICD-10-CM | POA: Diagnosis not present

## 2024-02-15 DIAGNOSIS — K2980 Duodenitis without bleeding: Secondary | ICD-10-CM | POA: Diagnosis present

## 2024-02-15 DIAGNOSIS — E663 Overweight: Secondary | ICD-10-CM | POA: Diagnosis present

## 2024-02-15 DIAGNOSIS — K5730 Diverticulosis of large intestine without perforation or abscess without bleeding: Secondary | ICD-10-CM | POA: Diagnosis not present

## 2024-02-15 DIAGNOSIS — Z6829 Body mass index (BMI) 29.0-29.9, adult: Secondary | ICD-10-CM | POA: Diagnosis not present

## 2024-02-15 DIAGNOSIS — Z79899 Other long term (current) drug therapy: Secondary | ICD-10-CM | POA: Diagnosis not present

## 2024-02-15 DIAGNOSIS — E11610 Type 2 diabetes mellitus with diabetic neuropathic arthropathy: Secondary | ICD-10-CM | POA: Diagnosis present

## 2024-02-15 DIAGNOSIS — D120 Benign neoplasm of cecum: Secondary | ICD-10-CM | POA: Diagnosis not present

## 2024-02-15 DIAGNOSIS — K449 Diaphragmatic hernia without obstruction or gangrene: Secondary | ICD-10-CM | POA: Diagnosis present

## 2024-02-15 LAB — CBC WITH DIFFERENTIAL/PLATELET
Abs Immature Granulocytes: 0.07 K/uL (ref 0.00–0.07)
Basophils Absolute: 0.1 K/uL (ref 0.0–0.1)
Basophils Relative: 1 %
Eosinophils Absolute: 0.2 K/uL (ref 0.0–0.5)
Eosinophils Relative: 2 %
HCT: 31.9 % — ABNORMAL LOW (ref 39.0–52.0)
Hemoglobin: 10.6 g/dL — ABNORMAL LOW (ref 13.0–17.0)
Immature Granulocytes: 1 %
Lymphocytes Relative: 27 %
Lymphs Abs: 2.9 K/uL (ref 0.7–4.0)
MCH: 29.9 pg (ref 26.0–34.0)
MCHC: 33.2 g/dL (ref 30.0–36.0)
MCV: 89.9 fL (ref 80.0–100.0)
Monocytes Absolute: 0.6 K/uL (ref 0.1–1.0)
Monocytes Relative: 5 %
Neutro Abs: 7.2 K/uL (ref 1.7–7.7)
Neutrophils Relative %: 64 %
Platelets: 253 K/uL (ref 150–400)
RBC: 3.55 MIL/uL — ABNORMAL LOW (ref 4.22–5.81)
RDW: 13.3 % (ref 11.5–15.5)
WBC: 11 K/uL — ABNORMAL HIGH (ref 4.0–10.5)
nRBC: 0 % (ref 0.0–0.2)

## 2024-02-15 LAB — CBC
HCT: 31.7 % — ABNORMAL LOW (ref 39.0–52.0)
Hemoglobin: 10.4 g/dL — ABNORMAL LOW (ref 13.0–17.0)
MCH: 29.5 pg (ref 26.0–34.0)
MCHC: 32.8 g/dL (ref 30.0–36.0)
MCV: 90.1 fL (ref 80.0–100.0)
Platelets: 253 K/uL (ref 150–400)
RBC: 3.52 MIL/uL — ABNORMAL LOW (ref 4.22–5.81)
RDW: 13.5 % (ref 11.5–15.5)
WBC: 11.6 K/uL — ABNORMAL HIGH (ref 4.0–10.5)
nRBC: 0 % (ref 0.0–0.2)

## 2024-02-15 LAB — CBG MONITORING, ED
Glucose-Capillary: 171 mg/dL — ABNORMAL HIGH (ref 70–99)
Glucose-Capillary: 210 mg/dL — ABNORMAL HIGH (ref 70–99)

## 2024-02-15 LAB — PROTIME-INR
INR: 1.1 (ref 0.8–1.2)
Prothrombin Time: 14.5 s (ref 11.4–15.2)

## 2024-02-15 LAB — TYPE AND SCREEN
ABO/RH(D): A POS
Antibody Screen: NEGATIVE

## 2024-02-15 LAB — HEMOGLOBIN AND HEMATOCRIT, BLOOD
HCT: 28.4 % — ABNORMAL LOW (ref 39.0–52.0)
Hemoglobin: 9.4 g/dL — ABNORMAL LOW (ref 13.0–17.0)

## 2024-02-15 MED ORDER — ACETAMINOPHEN 325 MG PO TABS
650.0000 mg | ORAL_TABLET | Freq: Four times a day (QID) | ORAL | Status: DC | PRN
  Administered 2024-02-15 (×2): 650 mg via ORAL
  Filled 2024-02-15 (×3): qty 2

## 2024-02-15 MED ORDER — SODIUM CHLORIDE 0.9 % IV SOLN: INTRAVENOUS | Status: AC

## 2024-02-15 MED ORDER — GABAPENTIN 300 MG PO CAPS
300.0000 mg | ORAL_CAPSULE | Freq: Every day | ORAL | Status: DC
  Administered 2024-02-15: 300 mg via ORAL
  Filled 2024-02-15: qty 1

## 2024-02-15 MED ORDER — AMLODIPINE BESYLATE 5 MG PO TABS
5.0000 mg | ORAL_TABLET | Freq: Every day | ORAL | Status: DC
  Administered 2024-02-15: 5 mg via ORAL
  Filled 2024-02-15: qty 1

## 2024-02-15 MED ORDER — PRAVASTATIN SODIUM 20 MG PO TABS
20.0000 mg | ORAL_TABLET | Freq: Every day | ORAL | Status: DC
  Administered 2024-02-15: 20 mg via ORAL
  Filled 2024-02-15: qty 1

## 2024-02-15 MED ORDER — TRAZODONE HCL 50 MG PO TABS: 25.0000 mg | ORAL_TABLET | Freq: Every evening | ORAL | Status: DC | PRN

## 2024-02-15 MED ORDER — ONDANSETRON HCL 4 MG PO TABS: 4.0000 mg | ORAL_TABLET | Freq: Four times a day (QID) | ORAL | Status: DC | PRN

## 2024-02-15 MED ORDER — GLIPIZIDE 10 MG PO TABS
10.0000 mg | ORAL_TABLET | Freq: Every day | ORAL | Status: DC
  Filled 2024-02-15 (×2): qty 1

## 2024-02-15 MED ORDER — IOHEXOL 350 MG/ML SOLN
100.0000 mL | Freq: Once | INTRAVENOUS | Status: AC | PRN
  Administered 2024-02-15: 100 mL via INTRAVENOUS

## 2024-02-15 MED ORDER — ZINC SULFATE 220 (50 ZN) MG PO CAPS
220.0000 mg | ORAL_CAPSULE | Freq: Every day | ORAL | Status: DC
  Filled 2024-02-15: qty 1

## 2024-02-15 MED ORDER — ACETAMINOPHEN 650 MG RE SUPP: 650.0000 mg | Freq: Four times a day (QID) | RECTAL | Status: DC | PRN

## 2024-02-15 MED ORDER — LOSARTAN POTASSIUM 50 MG PO TABS: 50.0000 mg | ORAL_TABLET | Freq: Every day | ORAL | Status: DC

## 2024-02-15 MED ORDER — PEG 3350-KCL-NA BICARB-NACL 420 G PO SOLR
4000.0000 mL | Freq: Once | ORAL | Status: AC
  Administered 2024-02-15: 4000 mL via ORAL
  Filled 2024-02-15: qty 4000

## 2024-02-15 MED ORDER — PANTOPRAZOLE SODIUM 40 MG IV SOLR
40.0000 mg | Freq: Two times a day (BID) | INTRAVENOUS | Status: DC
  Administered 2024-02-15 – 2024-02-16 (×3): 40 mg via INTRAVENOUS
  Filled 2024-02-15 (×3): qty 10

## 2024-02-15 MED ORDER — INSULIN NPH (HUMAN) (ISOPHANE) 100 UNIT/ML ~~LOC~~ SUSP: 15.0000 [IU] | Freq: Two times a day (BID) | SUBCUTANEOUS | Status: DC

## 2024-02-15 MED ORDER — ONDANSETRON HCL 4 MG/2ML IJ SOLN
4.0000 mg | Freq: Four times a day (QID) | INTRAMUSCULAR | Status: DC | PRN
Start: 2024-02-15 — End: 2024-02-16

## 2024-02-15 NOTE — H&P (Addendum)
 [place]   PATIENT NAME: Dillon Henry    MR#:  [id number]  DATE OF BIRTH:  [birth year]  DATE OF ADMISSION:  02/14/2024  PRIMARY CARE PHYSICIAN: Alla Amis, MD   Patient is coming from: Home  REQUESTING/REFERRING PHYSICIAN: Clarine Sharper, MD  CHIEF COMPLAINT:   Chief Complaint  Patient presents with   Rectal Bleeding    HISTORY OF PRESENT ILLNESS:  Dillon Henry is a 73 y.o. Caucasian male with medical history significant for type 2 diabetes mellitus, hypertension, dyslipidemia, diverticulosis, osteoarthritis and Charcot's arthropathy, who presented to the emergency room with acute onset of recurrent bright red bleeding per rectum since 8 PM.  He admitted to a couple of melanotic stools while he was in the ER.  He denied any nausea or vomiting or heartburn.  No abdominal pain.  No fever or chills.  He denied any cough or wheezing or hemoptysis.  No dysuria, oliguria or hematuria or flank pain.  No other bleeding diathesis.  No chest pain or palpitations.  No paresthesias or focal muscle weakness.  ED Course: When the patient came to the ER, BP was 160/87 with otherwise normal vital signs.  Labs revealed blood glucose of 151 and chloride 97 with otherwise unremarkable CMP.  CBC showed hemoglobin 12.6 hematocrit 37.9 and few hours later H&H were 10.6 and 31.9 with WBCs of 10.3 earlier and later 11, platelets 255 and later 253 with normal PT and INR.  Blood group was A+ with negative antibody screen. EKG as reviewed by me : None. Imaging: CT Angio GI bleed revealed the following: 1. No active GI bleeding. 2. 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 3. 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 Okay 4. 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 5. Mild hepatic  prominence and steatosis. 6. Prostatomegaly. 7. Sigmoid diverticulosis without evidence of diverticulitis.  The patient will be admitted to a progressive unit bed for further evaluation and management.  PAST MEDICAL HISTORY:   Past Medical History:  Diagnosis Date   Arthritis    osteoarthritis   Charcot's arthropathy    Diabetes mellitus 1994   Type II   Diabetic retinopathy (HCC)    Dysrhythmia    AF, history of an ablation in 2005   Hammer toe of left foot    Hyperlipidemia 05/1997   Hypertension 1995   Neuromuscular disorder (HCC)    polyneuropathies d/t diabetes   Osteomyelitis (HCC) 01/2020   left great toe   Skin ulcer (HCC) 01/2020   full thickness ulceration over IPJ.   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    PAST SURGICAL HISTORY:   Past Surgical History:  Procedure Laterality Date   AMPUTATION TOE Left 02/17/2020   Procedure: AMPUTATION TOE MPJ LEFT;  Surgeon: Neill Boas, DPM;  Location: [facility] ORS;  Service: Podiatry;  Laterality: Left;   ATRIAL ABLATION SURGERY  2005   For A Fib   COLONOSCOPY WITH PROPOFOL  N/A 12/23/2016   Procedure: COLONOSCOPY WITH PROPOFOL ;  Surgeon: Gaylyn Gladis PENNER, MD;  Location: [facility] ENDOSCOPY;  Service: Endoscopy;  Laterality: N/A;   COLONOSCOPY WITH PROPOFOL  N/A 03/09/2023   Procedure: COLONOSCOPY WITH PROPOFOL ;  Surgeon: Maryruth Ole DASEN, MD;  Location: [facility] ENDOSCOPY;  Service: Endoscopy;  Laterality: N/A;   DOBUTAMINE  STRESS ECHO  11/26/2005   Nonsust VTach HTSVE ECG  NML   DOPPLER ECHOCARDIOGRAPHY  1999   Normal, EF 60%   DOPPLER ECHOCARDIOGRAPHY  01/11/2001   Mild CMC, LVH, TR MR, TR: ([place])   ETT  06/1998   Normal   Exercise tolerance test  05/1998   OK   POLYPECTOMY  03/09/2023   Procedure: POLYPECTOMY;  Surgeon: Maryruth Ole DASEN, MD;  Location: [facility] ENDOSCOPY;  Service: Endoscopy;;   Stress cardilite  12/1997   Normal   Stress cardiolite  03/02/2002   WNL  EF 70%   TOE AMPUTATION      TONSILLECTOMY AND ADENOIDECTOMY     TOTAL KNEE ARTHROPLASTY Right 10/09/2020   Procedure: TOTAL KNEE ARTHROPLASTY - [place] Amber to Assist;  Surgeon: Kathlynn Sharper, MD;  Location: [facility] ORS;  Service: Orthopedics;  Laterality: Right;   VASECTOMY      SOCIAL HISTORY:   Social History   Tobacco Use   Smoking status: Former    Current packs/day: 0.00    Average packs/day: 1 pack/day for 10.0 years (10.0 ttl pk-yrs)    Types: Cigarettes    Start date: 03/31/1966    Quit date: 03/31/1976    Years since quitting: 47.9   Smokeless tobacco: Former  Substance Use Topics   Alcohol use: Never    FAMILY HISTORY:   Family History  Problem Relation Age of Onset   Cancer Father        Leukemia   Stroke Father    Cancer Sister        Colon   Heart disease Other        Multiple MI's   Stroke Other        90's   Heart disease Other    Diabetes Other    Diabetes Maternal Grandmother    Depression Neg Hx    Alcohol abuse Neg Hx    Drug abuse Neg Hx     DRUG ALLERGIES:   Allergies  Allergen Reactions   Lisinopril Cough   Pioglitazone Other (See Comments)    TOOTH PAIN   Rosiglitazone Other (See Comments)    WEIGHT GAIN    REVIEW OF SYSTEMS:   ROS As per history of present illness. All pertinent systems were reviewed above. Constitutional, HEENT, cardiovascular, respiratory, GI, GU, musculoskeletal, neuro, psychiatric, endocrine, integumentary and hematologic systems were reviewed and are otherwise negative/unremarkable except for positive findings mentioned above in the HPI.   MEDICATIONS AT HOME:   Prior to Admission medications   Medication Sig Start Date End Date Taking? Authorizing Provider  acetaminophen  (TYLENOL ) 500 MG tablet Take 1,000 mg by mouth every 8 (eight) hours.    [provider]  amLODipine  (NORVASC ) 5 MG tablet Take 5 mg by mouth at bedtime.     [provider]  aspirin  EC 81 MG tablet Take 81 mg by mouth daily.    [provider]   enoxaparin  (LOVENOX ) 40 MG/0.4ML injection Inject 0.4 mLs (40 mg total) into the skin daily for 14 days. 10/11/20 10/25/20  Claudene Katz B, PA-C  gabapentin  (NEURONTIN ) 300 MG capsule Take 300 mg by mouth at bedtime.    [provider]  glipiZIDE  (GLUCOTROL ) 10 MG tablet Take 10 mg by mouth daily before breakfast.  10/04/19 10/03/20  [provider]  hydrochlorothiazide  (HYDRODIURIL ) 25 MG tablet Take 25 mg by mouth daily.    [provider]  HYDROcodone -acetaminophen  (NORCO/VICODIN) 5-325 MG tablet Take 1-2 tablets by mouth every 4 (four) hours as needed for moderate pain (pain score 4-6). 10/11/20  Claudene Morene NOVAK, PA-C  insulin  NPH Human (NOVOLIN N) 100 UNIT/ML injection Inject 15 Units into the skin 2 (two) times daily.    [provider]  insulin  regular (NOVOLIN R,HUMULIN R ) 100 units/mL injection Inject 5-12 Units into the skin 3 (three) times daily as needed for high blood sugar.    [provider]  losartan  (COZAAR ) 50 MG tablet Take 50 mg by mouth every morning.  12/08/17   [provider]  magnesium  oxide (MAG-OX) 400 MG tablet Take 400 mg by mouth every evening.     [provider]  metFORMIN  (GLUCOPHAGE ) 1000 MG tablet Take 1,000 mg by mouth 2 (two) times daily with a meal.     [provider]  Polyvinyl Alcohol (LIQUID TEARS OP) Place 1 drop into both eyes daily as needed (dryness/irritation).    [provider]  pravastatin  (PRAVACHOL ) 20 MG tablet Take 20 mg by mouth at bedtime. 12/10/17   [provider]  zinc  gluconate 50 MG tablet Take 50 mg by mouth daily.    [provider]      VITAL SIGNS:  Blood pressure 133/78, pulse 86, temperature 98.3 F (36.8 C), temperature source Oral, resp. rate 17, height 6' 2 (1.88 m), weight 104.3 kg, SpO2 98%.  PHYSICAL EXAMINATION:  Physical Exam  GENERAL:  73 y.o.-year-old Caucasian male patient lying in the bed with no acute distress.   EYES: Pupils equal, round, reactive to light and accommodation. No scleral icterus. Extraocular muscles intact.  HEENT: Head atraumatic, normocephalic. Oropharynx and nasopharynx clear.  NECK:  Supple, no jugular venous distention. No thyroid enlargement, no tenderness.  LUNGS: Normal breath sounds bilaterally, no wheezing, rales,rhonchi or crepitation. No use of accessory muscles of respiration.  CARDIOVASCULAR: Regular rate and rhythm, S1, S2 normal. No murmurs, rubs, or gallops.  ABDOMEN: Soft, nondistended, nontender. Bowel sounds present. No organomegaly or mass.  EXTREMITIES: No pedal edema, cyanosis, or clubbing.  NEUROLOGIC: Cranial nerves II through XII are intact. Muscle strength 5/5 in all extremities. Sensation intact. Gait not checked.  PSYCHIATRIC: The patient is alert and oriented x 3.  Normal affect and good eye contact. SKIN: No obvious rash, lesion, or ulcer.   LABORATORY PANEL:   CBC Recent Labs  Lab 02/15/24 0206  WBC 11.0*  HGB 10.6*  HCT 31.9*  PLT 253   ------------------------------------------------------------------------------------------------------------------  Chemistries  Recent Labs  Lab 02/14/24 2158  NA 135  K 4.0  CL 97*  CO2 23  GLUCOSE 151*  BUN 17  CREATININE 0.83  CALCIUM 9.2  AST 22  ALT 21  ALKPHOS 58  BILITOT 0.6   ------------------------------------------------------------------------------------------------------------------  Cardiac Enzymes No results for input(s): TROPONINI in the last 168 hours. ------------------------------------------------------------------------------------------------------------------  RADIOLOGY:  CT ANGIO GI BLEED Result Date: 02/15/2024 EXAM: CTA ABDOMEN AND PELVIS WITH CONTRAST 02/15/2024 03:25:45 AM TECHNIQUE: CTA images of the abdomen and pelvis with intravenous contrast. Three-dimensional MIP/volume rendered formations were performed. Automated exposure control, iterative  reconstruction, and/or weight based adjustment of the mA/kV was utilized to reduce the radiation dose to as low as reasonably achievable. COMPARISON: None available. CLINICAL HISTORY: Rectal bleeding, drop in hemoglobin. FINDINGS: VASCULATURE: GI BLEED: No active extravasation of contrast within the GI tract. AORTA: There are mild to moderate patchy aortic calcific plaques without aneurysm, dissection, stenosis or penetrating ulcer. CELIAC TRUNK: There is compression on the celiac artery origin by the median arcuate ligament of the diaphragm causing 40-50 percent vessel origin stenosis. Remainder of the celiac artery is widely  patent. There is moderate patchy mixed plaque in the splenic artery but no branch occlusions. SUPERIOR MESENTERIC ARTERY: No acute finding. No occlusion or significant stenosis. INFERIOR MESENTERIC ARTERY: There is a moderate soft plaque origin stenosis of the IMA; otherwise, the vessel opacified well. RENAL ARTERIES: 2 arteries supply the right kidney, and a single artery supplies the left kidney. The left renal artery is widely patent. On the right, a small caliber upper pole artery arises first and is clear. The dominant artery arises second and demonstrates 40 percent calcific stenosis proximally. Otherwise it is widely patent. There are no hilar branch occlusions bilaterally. ILIAC ARTERIES: No acute finding. There are mild scattered nonstenotic calcific plaques. No occlusion or significant stenosis. ABDOMEN/PELVIS: LOWER CHEST: Scarring change in the lung bases without infiltrate. The cardiac size is normal. There is a small hiatal hernia. There are coronary artery calcifications. LIVER: The liver is 22 cm in length and mildly steatotic. There is no mass enhancement. GALLBLADDER AND BILE DUCTS: Gallbladder is unremarkable. No biliary ductal dilatation. SPLEEN: The spleen is unremarkable. PANCREAS: The pancreas is unremarkable. ADRENAL GLANDS: Bilateral adrenal glands demonstrate no acute  abnormality. There is no adrenal mass. KIDNEYS, URETERS AND BLADDER: No stones in the kidneys or ureters. No hydronephrosis. No perinephric or periureteral stranding. There is no renal mass. The urinary bladder is unremarkable, allowing for partial distention. GI AND BOWEL: The gastric wall and nonopacified small bowel are unremarkable. The appendix is unremarkable. There is scattered fluid in the colon. There is no bowel obstruction. No abnormal bowel wall thickening or distension. REPRODUCTIVE: The prostate is mildly enlarged. Other visualized reproductive organs are unremarkable. PERITONEUM AND RETROPERITONEUM: No ascites or free air. LYMPH NODES: No lymphadenopathy. BONES AND SOFT TISSUES: There is osteopenia, degenerative change of the thoracic and lumbar spine, advanced L4-L5 facet hypertrophy and grade 1 L4-L5 spondylolisthesis. No acute or other significant osseous findings. No acute soft tissue abnormality. IMPRESSION: 1. No active GI bleeding. 2. 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 3. 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 Okay 4. 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 5. Mild hepatic prominence and steatosis. 6. Prostatomegaly. 7. Sigmoid diverticulosis without evidence of diverticulitis. Electronically signed by: Francis Quam MD 02/15/2024 03:54 AM EST RP Workstation: [id number]      IMPRESSION AND PLAN:  Assessment and Plan: * GI bleeding - The patient had bright red bleeding per rectum as well as melena.  The patient has subsequent acute blood loss anemia. - He will be admitted to a progressive unit bed. - Will follow serial hemoglobins and hematocrits. - Will place him on IV Protonix . - We will keep him NPO. - GI consult will be obtained. - I notified Dr. Unk about the  patient.   Type 2 diabetes mellitus with peripheral neuropathy (HCC) - The patient will be placed on supplemental coverage with NovoLog . - Will continue his basal coverage. - Will continue Neurontin .  Essential hypertension We will continue antihypertensive therapy while holding off diuretics.  Dyslipidemia - Will continue statin therapy.   DVT prophylaxis: SCDs. Advanced Care Planning:  Code Status: full code. Family Communication:  The plan of care was discussed in details with the patient (and family). I answered all questions. The patient agreed to proceed with the above mentioned plan. Further management will depend upon hospital course. Disposition Plan: Back to previous home environment  Consults called: GI. All the records are reviewed and case discussed with ED provider.  Status is: Inpatient  At the time of the admission, it appears that the appropriate admission status for this patient is inpatient.  This is judged to be reasonable and necessary in order to provide the required intensity of service to ensure the patient's safety given the presenting symptoms, physical exam findings and initial radiographic and laboratory data in the context of comorbid conditions.  The patient requires inpatient status due to high intensity of service, high risk of further deterioration and high frequency of surveillance required.  I certify that at the time of admission, it is my clinical judgment that the patient will require inpatient hospital care extending more than 2 midnights.                            Dispo: The patient is from: Home              Anticipated d/c is to: Home              Patient currently is not medically stable to d/c.              Difficult to place patient: No  Madison DELENA Peaches M.D on 02/15/2024 at 5:28 AM  [facility]   From 7 PM-7 AM, contact night-coverage [email or web address]  CC: Primary care physician; Alla Amis, MD

## 2024-02-15 NOTE — Assessment & Plan Note (Signed)
-   The patient will be placed on supplemental coverage with NovoLog . - Will continue his basal coverage. - Will continue Neurontin .

## 2024-02-15 NOTE — ED Notes (Signed)
 Pt agitated regarding cardiac alarms. Pt with frequent PVCs, 2 at a time which have been causing the alarm to go off. Pt states that he would like the monitor turned off or her will call his wife to take him home. Educated on reason for CCM and cause of the alarm. Pt states he understands but he cannot stay if the monitor has to stay on. Pt agreeable to vital signs being taken every 4 hours. CCM turned off at this time. MD notified

## 2024-02-15 NOTE — ED Notes (Signed)
 Pt given clear liquids tray

## 2024-02-15 NOTE — Progress Notes (Signed)
-----------------------------------------------------------  [facility] --------------------------------------------------------D(Data) A(Action) R(response) Note------------------------------------------------  Patient Name: Dillon Henry Patient DOB: 1951/01/14 Date: @TODAY @      Data: Reviewed VS, labs    Action: No action at this time.     Response:       Sharolyn Batman, RN [facility]

## 2024-02-15 NOTE — Consult Note (Signed)
 Dillon Copping, MD [facility]  [address] [place], [place] [address] Phone: [phone] Fax : [phone]  Consultation  Referring Provider:     Dr. Lawence Primary Care Physician:  Alla Amis, MD Primary Gastroenterologist:  Dr. Maryruth         Reason for Consultation:     GI bleeding  Date of Admission:  02/14/2024 Date of Consultation:  02/15/2024         HPI:   Dillon Henry is a 73 y.o. male with history of osteoarthritis with dyslipidemia hypertension and diabetes and a history of diverticulosis.  The patient had a colonoscopy less than a year ago and has a family history of colon cancer.  The patient reports that he was doing well yesterday and even went to church and played guitar at the church but then later that evening at 8 PM the patient had a watery bowel movement which she reports was bright red blood.  The patient reported multiple episodes of bright red blood last night and then he had an episode of dark black diarrhea.  The patient then reports that the stools went back to being bright red in color.  He had a CT angiography that did not show any sign of active GI bleeding.  He reports that the stools are now more of a fruit punch color and not as red as previously.  The patient's most recent blood work is shown:  Component     Latest Ref Rng 10/10/2020 02/14/2024 02/15/2024  Hemoglobin     13.0 - 17.0 g/dL 88.6 (L)  87.3 (L)  89.5 (L)   Hemoglobin        10.6 (L)   HCT     39.0 - 52.0 % 32.9 (L)  37.9 (L)  31.7 (L)   HCT        31.9 (L)    He denies any abdominal pain except this morning which he states is likely from hunger.  He also denies any hematemesis.  He does take Tylenol  for his arthritis and also avoids NSAIDs.  He does take a baby aspirin  daily.  The patient also reports that he has had dysphagia that he has not reported to anybody since he was concerned about what might be found.  Past Medical History:  Diagnosis Date   Arthritis     osteoarthritis   Charcot's arthropathy    Diabetes mellitus 1994   Type II   Diabetic retinopathy (HCC)    Dysrhythmia    AF, history of an ablation in 2005   Hammer toe of left foot    Hyperlipidemia 05/1997   Hypertension 1995   Neuromuscular disorder (HCC)    polyneuropathies d/t diabetes   Osteomyelitis (HCC) 01/2020   left great toe   Skin ulcer (HCC) 01/2020   full thickness ulceration over IPJ.   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    Past Surgical History:  Procedure Laterality Date   AMPUTATION TOE Left 02/17/2020   Procedure: AMPUTATION TOE MPJ LEFT;  Surgeon: Neill Boas, DPM;  Location: [facility] ORS;  Service: Podiatry;  Laterality: Left;   ATRIAL ABLATION SURGERY  2005   For A Fib   COLONOSCOPY WITH PROPOFOL  N/A 12/23/2016   Procedure: COLONOSCOPY WITH PROPOFOL ;  Surgeon: Gaylyn Gladis PENNER, MD;  Location: [facility] ENDOSCOPY;  Service: Endoscopy;  Laterality: N/A;   COLONOSCOPY WITH PROPOFOL  N/A 03/09/2023   Procedure: COLONOSCOPY WITH PROPOFOL ;  Surgeon: Maryruth Smalls  T, MD;  Location: [facility] ENDOSCOPY;  Service: Endoscopy;  Laterality: N/A;   DOBUTAMINE  STRESS ECHO  11/26/2005   Nonsust VTach HTSVE ECG NML   DOPPLER ECHOCARDIOGRAPHY  1999   Normal, EF 60%   DOPPLER ECHOCARDIOGRAPHY  01/11/2001   Mild CMC, LVH, TR MR, TR: ([place])   ETT  06/1998   Normal   Exercise tolerance test  05/1998   OK   POLYPECTOMY  03/09/2023   Procedure: POLYPECTOMY;  Surgeon: Maryruth Ole DASEN, MD;  Location: [facility] ENDOSCOPY;  Service: Endoscopy;;   Stress cardilite  12/1997   Normal   Stress cardiolite  03/02/2002   WNL  EF 70%   TOE AMPUTATION     TONSILLECTOMY AND ADENOIDECTOMY     TOTAL KNEE ARTHROPLASTY Right 10/09/2020   Procedure: TOTAL KNEE ARTHROPLASTY - Medford Amber to Assist;  Surgeon: Kathlynn Sharper, MD;  Location: [facility] ORS;  Service: Orthopedics;  Laterality: Right;   VASECTOMY      Prior to Admission medications   Medication Sig Start Date End  Date Taking? Authorizing Provider  acetaminophen  (TYLENOL ) 500 MG tablet Take 1,000 mg by mouth every 8 (eight) hours.   Yes [provider]  amLODipine  (NORVASC ) 5 MG tablet Take 5 mg by mouth at bedtime.    Yes [provider]  aspirin  EC 81 MG tablet Take 81 mg by mouth daily.   Yes [provider]  gabapentin  (NEURONTIN ) 300 MG capsule Take 300 mg by mouth at bedtime.   Yes [provider]  glipiZIDE  (GLUCOTROL ) 10 MG tablet Take 10 mg by mouth daily before breakfast.  10/04/19 02/15/24 Yes [provider]  hydrochlorothiazide  (HYDRODIURIL ) 25 MG tablet Take 25 mg by mouth daily.   Yes [provider]  insulin  regular (NOVOLIN R,HUMULIN R ) 100 units/mL injection Inject 5-12 Units into the skin 3 (three) times daily as needed for high blood sugar.   Yes [provider]  LANTUS 100 UNIT/ML injection Inject 60 Units into the skin daily. 11/12/23  Yes [provider]  losartan  (COZAAR ) 50 MG tablet Take 50 mg by mouth every morning.  12/08/17  Yes [provider]  magnesium  oxide (MAG-OX) 400 MG tablet Take 400 mg by mouth every evening.    Yes [provider]  metFORMIN  (GLUCOPHAGE ) 1000 MG tablet Take 1,000 mg by mouth 2 (two) times daily with a meal.    Yes [provider]  Polyvinyl Alcohol (LIQUID TEARS OP) Place 1 drop into both eyes daily as needed (dryness/irritation).   Yes [provider]  pravastatin  (PRAVACHOL ) 20 MG tablet Take 20 mg by mouth at bedtime. 12/10/17  Yes [provider]  zinc  gluconate 50 MG tablet Take 50 mg by mouth daily.   Yes [provider]  enoxaparin  (LOVENOX ) 40 MG/0.4ML injection Inject 0.4 mLs (40 mg total) into the skin daily for 14 days. 10/11/20 10/25/20  Smith, Benjamin B, PA-C  HYDROcodone -acetaminophen  (NORCO/VICODIN) 5-325 MG tablet Take 1-2 tablets by mouth every 4 (four) hours as needed for moderate pain (pain score 4-6). Patient not  taking: Reported on 02/15/2024 10/11/20   Claudene Morene KATHEE DEVONNA    Family History  Problem Relation Age of Onset   Cancer Father        Leukemia   Stroke Father    Cancer Sister        Colon   Heart disease Other        Multiple MI's   Stroke Other  90's   Heart disease Other    Diabetes Other    Diabetes Maternal Grandmother    Depression Neg Hx    Alcohol abuse Neg Hx    Drug abuse Neg Hx      Social History   Tobacco Use   Smoking status: Former    Current packs/day: 0.00    Average packs/day: 1 pack/day for 10.0 years (10.0 ttl pk-yrs)    Types: Cigarettes    Start date: 03/31/1966    Quit date: 03/31/1976    Years since quitting: 47.9   Smokeless tobacco: Former  Building Services Engineer status: Never Used  Substance Use Topics   Alcohol use: Never   Drug use: Never    Allergies as of 02/14/2024 - Review Complete 02/14/2024  Allergen Reaction Noted   Lisinopril Cough 08/14/2017   Pioglitazone Other (See Comments) 03/05/2007   Rosiglitazone Other (See Comments) 03/05/2007    Review of Systems:    All systems reviewed and negative except where noted in HPI.   Physical Exam:  Vital signs in last 24 hours: Temp:  [98.1 F (36.7 C)-98.3 F (36.8 C)] 98.2 F (36.8 C) (11/17 0736) Pulse Rate:  [84-100] 84 (11/17 0557) Resp:  [16-20] 17 (11/17 0730) BP: (118-160)/(69-87) 139/75 (11/17 0730) SpO2:  [98 %-99 %] 98 % (11/17 0557) Weight:  [104.3 kg] 104.3 kg (11/16 2140)   General:   Pleasant, cooperative in NAD Head:  Normocephalic and atraumatic. Eyes:   No icterus.   Conjunctiva pink. PERRLA. Ears:  Normal auditory acuity. Neck:  Supple; no masses or thyroidomegaly Lungs: Respirations even and unlabored. Lungs clear to auscultation bilaterally.   No wheezes, crackles, or rhonchi.  Heart:  Regular rate and rhythm;  Without murmur, clicks, rubs or gallops Abdomen:  Soft, nondistended, nontender. Normal bowel sounds. No appreciable masses or hepatomegaly.   No rebound or guarding.  Rectal:  Not performed. Msk:  Symmetrical without gross deformities.    Extremities:  Without edema, cyanosis or clubbing. Neurologic:  Alert and oriented x3;  grossly normal neurologically. Skin:  Intact without significant lesions or rashes. Cervical Nodes:  No significant cervical adenopathy. Psych:  Alert and cooperative. Normal affect.  LAB RESULTS: Recent Labs    02/14/24 2158 02/15/24 0206 02/15/24 0542  WBC 10.3 11.0* 11.6*  HGB 12.6* 10.6* 10.4*  HCT 37.9* 31.9* 31.7*  PLT 255 253 253   BMET Recent Labs    02/14/24 2158  NA 135  K 4.0  CL 97*  CO2 23  GLUCOSE 151*  BUN 17  CREATININE 0.83  CALCIUM 9.2   LFT Recent Labs    02/14/24 2158  PROT 6.5  ALBUMIN 4.2  AST 22  ALT 21  ALKPHOS 58  BILITOT 0.6   PT/INR Recent Labs    02/15/24 0348  LABPROT 14.5  INR 1.1    STUDIES: CT ANGIO GI BLEED Result Date: 02/15/2024 EXAM: CTA ABDOMEN AND PELVIS WITH CONTRAST 02/15/2024 03:25:45 AM TECHNIQUE: CTA images of the abdomen and pelvis with intravenous contrast. Three-dimensional MIP/volume rendered formations were performed. Automated exposure control, iterative reconstruction, and/or weight based adjustment of the mA/kV was utilized to reduce the radiation dose to as low as reasonably achievable. COMPARISON: None available. CLINICAL HISTORY: Rectal bleeding, drop in hemoglobin. FINDINGS: VASCULATURE: GI BLEED: No active extravasation of contrast within the GI tract. AORTA: There are mild to moderate patchy aortic calcific plaques without aneurysm, dissection, stenosis or penetrating ulcer. CELIAC TRUNK: There is compression on the celiac  artery origin by the median arcuate ligament of the diaphragm causing 40-50 percent vessel origin stenosis. Remainder of the celiac artery is widely patent. There is moderate patchy mixed plaque in the splenic artery but no branch occlusions. SUPERIOR MESENTERIC ARTERY: No acute finding. No occlusion or  significant stenosis. INFERIOR MESENTERIC ARTERY: There is a moderate soft plaque origin stenosis of the IMA; otherwise, the vessel opacified well. RENAL ARTERIES: 2 arteries supply the right kidney, and a single artery supplies the left kidney. The left renal artery is widely patent. On the right, a small caliber upper pole artery arises first and is clear. The dominant artery arises second and demonstrates 40 percent calcific stenosis proximally. Otherwise it is widely patent. There are no hilar branch occlusions bilaterally. ILIAC ARTERIES: No acute finding. There are mild scattered nonstenotic calcific plaques. No occlusion or significant stenosis. ABDOMEN/PELVIS: LOWER CHEST: Scarring change in the lung bases without infiltrate. The cardiac size is normal. There is a small hiatal hernia. There are coronary artery calcifications. LIVER: The liver is 22 cm in length and mildly steatotic. There is no mass enhancement. GALLBLADDER AND BILE DUCTS: Gallbladder is unremarkable. No biliary ductal dilatation. SPLEEN: The spleen is unremarkable. PANCREAS: The pancreas is unremarkable. ADRENAL GLANDS: Bilateral adrenal glands demonstrate no acute abnormality. There is no adrenal mass. KIDNEYS, URETERS AND BLADDER: No stones in the kidneys or ureters. No hydronephrosis. No perinephric or periureteral stranding. There is no renal mass. The urinary bladder is unremarkable, allowing for partial distention. GI AND BOWEL: The gastric wall and nonopacified small bowel are unremarkable. The appendix is unremarkable. There is scattered fluid in the colon. There is no bowel obstruction. No abnormal bowel wall thickening or distension. REPRODUCTIVE: The prostate is mildly enlarged. Other visualized reproductive organs are unremarkable. PERITONEUM AND RETROPERITONEUM: No ascites or free air. LYMPH NODES: No lymphadenopathy. BONES AND SOFT TISSUES: There is osteopenia, degenerative change of the thoracic and lumbar spine, advanced  L4-L5 facet hypertrophy and grade 1 L4-L5 spondylolisthesis. No acute or other significant osseous findings. No acute soft tissue abnormality. IMPRESSION: 1. No active GI bleeding. 2. 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 3. 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 Okay 4. 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 5. Mild hepatic prominence and steatosis. 6. Prostatomegaly. 7. Sigmoid diverticulosis without evidence of diverticulitis. Electronically signed by: Francis Quam MD 02/15/2024 03:54 AM EST RP Workstation: [id number]      Impression / Plan:   Assessment: Principal Problem:   GI bleeding Active Problems:   Essential hypertension   Dyslipidemia   Type 2 diabetes mellitus with peripheral neuropathy (HCC)   ABLA (acute blood loss anemia)   Raydan Schlabach Poplar is a 73 y.o. y/o male with a report of bright red blood per rectum with a single episode of black stools with subsequent bowel movements being red again.  The patient's hemoglobin has been stable overnight but did drop about 2 g from admission.  The patient likely has a diverticular bleed and the dark stools that 1 time may have been just old blood in the colon and unlikely that the patient developed both an upper GI bleed and lower GI bleed at the same time.  The patient does report dysphagia and states that he feels like he has some sort of outpouching of his esophagus that is stopping food from going down.  Plan:  The patient will be set up for an EGD and colonoscopy for his GI bleeding and history of dysphagia.  The patient will be given a prep today and has been explained the procedures.  The patient had a colonoscopy less than a year ago which showed diverticulosis which is likely the cause of this patient's bleeding at this time.   The patient has been explained the plan and agrees with it.  Thank you for involving me in the care of this patient.      LOS: 0 days   Dillon Copping, MD, MD. NOLIA 02/15/2024, 8:05 AM,  Pager [phone] 7am-5pm  Check AMION for 5pm -7am coverage and on weekends   Note: This dictation was prepared with Dragon dictation along with smaller phrase technology. Any transcriptional errors that result from this process are unintentional.

## 2024-02-15 NOTE — Assessment & Plan Note (Addendum)
-   The patient had bright red bleeding per rectum as well as melena.  The patient has subsequent acute blood loss anemia. - He will be admitted to a progressive unit bed. - Will follow serial hemoglobins and hematocrits. - Will place him on IV Protonix . - We will keep him NPO. - GI consult will be obtained. - I notified Dr. Unk about the patient.

## 2024-02-15 NOTE — Progress Notes (Signed)
   Interim no charge progress note   This patient, Dillon Henry is a 73 y.o. male who was admitted by colleague earlier this morning for lower GI bleed.  He is currently receiving bowel prep with plans to undergo colonoscopy and EGD in AM.  At the time my evaluation he reports he is still having some watery bloody bowel movements.  He has just began the bowel prep.  He denies any abdominal pain at this time.  Heart rate is staying stable below 90.  We did discuss that if his hemoglobin continues to drop or he becomes hemodynamically unstable, we will pursue transfusion and he consents to this.  Hemoglobin this afternoon 9.4.  Will stop trending to avoid iatrogenic blood loss unless clinical picture changes.      02/15/2024    2:45 PM 02/15/2024    2:30 PM 02/15/2024    2:05 PM  Vitals with BMI  Systolic  138 146  Diastolic  72 78  Pulse 88 86 93       Latest Ref Rng & Units 02/15/2024    2:00 PM 02/15/2024    5:42 AM 02/15/2024    2:06 AM  CBC  WBC 4.0 - 10.5 K/uL  11.6  11.0   Hemoglobin 13.0 - 17.0 g/dL 9.4  89.5  89.3   Hematocrit 39.0 - 52.0 % 28.4  31.7  31.9   Platelets 150 - 400 K/uL  253  253        Latest Ref Rng & Units 02/14/2024    9:58 PM 10/10/2020    4:55 AM 10/09/2020   11:57 AM  BMP  Glucose 70 - 99 mg/dL 848  785    BUN 8 - 23 mg/dL 17  12    Creatinine 9.38 - 1.24 mg/dL 9.16  9.23  9.26   Sodium 135 - 145 mmol/L 135  134    Potassium 3.5 - 5.1 mmol/L 4.0  3.7    Chloride 98 - 111 mmol/L 97  96    CO2 22 - 32 mmol/L 23  28    Calcium 8.9 - 10.3 mg/dL 9.2  8.1

## 2024-02-15 NOTE — Assessment & Plan Note (Addendum)
-   The patient will be placed on supplemental coverage with NovoLog . - Will continue his basal coverage. - Will continue Neurontin .

## 2024-02-15 NOTE — Assessment & Plan Note (Signed)
 We will continue antihypertensive therapy while holding off diuretics.

## 2024-02-15 NOTE — Assessment & Plan Note (Signed)
 Will continue statin therapy

## 2024-02-15 NOTE — ED Notes (Signed)
 Pt noted I think my sugar might be crashing because I've been feeling off since my last couple BMs. Pt's BG and vitals are stable. MD notified.

## 2024-02-16 ENCOUNTER — Inpatient Hospital Stay

## 2024-02-16 ENCOUNTER — Encounter: Payer: Self-pay | Admitting: Gastroenterology

## 2024-02-16 ENCOUNTER — Encounter: Admission: EM | Disposition: A | Payer: Self-pay | Source: Home / Self Care | Attending: Family Medicine

## 2024-02-16 DIAGNOSIS — K2980 Duodenitis without bleeding: Secondary | ICD-10-CM

## 2024-02-16 DIAGNOSIS — K635 Polyp of colon: Secondary | ICD-10-CM

## 2024-02-16 DIAGNOSIS — D120 Benign neoplasm of cecum: Secondary | ICD-10-CM | POA: Diagnosis not present

## 2024-02-16 DIAGNOSIS — K5730 Diverticulosis of large intestine without perforation or abscess without bleeding: Secondary | ICD-10-CM | POA: Diagnosis not present

## 2024-02-16 DIAGNOSIS — K921 Melena: Secondary | ICD-10-CM | POA: Diagnosis not present

## 2024-02-16 DIAGNOSIS — D62 Acute posthemorrhagic anemia: Secondary | ICD-10-CM | POA: Diagnosis not present

## 2024-02-16 DIAGNOSIS — K449 Diaphragmatic hernia without obstruction or gangrene: Secondary | ICD-10-CM | POA: Diagnosis not present

## 2024-02-16 HISTORY — PX: ESOPHAGOGASTRODUODENOSCOPY: SHX5428

## 2024-02-16 HISTORY — PX: COLONOSCOPY: SHX5424

## 2024-02-16 LAB — CBC
HCT: 24.8 % — ABNORMAL LOW (ref 39.0–52.0)
Hemoglobin: 8.3 g/dL — ABNORMAL LOW (ref 13.0–17.0)
MCH: 30 pg (ref 26.0–34.0)
MCHC: 33.5 g/dL (ref 30.0–36.0)
MCV: 89.5 fL (ref 80.0–100.0)
Platelets: 171 K/uL (ref 150–400)
RBC: 2.77 MIL/uL — ABNORMAL LOW (ref 4.22–5.81)
RDW: 13.3 % (ref 11.5–15.5)
WBC: 6.1 K/uL (ref 4.0–10.5)
nRBC: 0 % (ref 0.0–0.2)

## 2024-02-16 LAB — CBG MONITORING, ED: Glucose-Capillary: 211 mg/dL — ABNORMAL HIGH (ref 70–99)

## 2024-02-16 LAB — BASIC METABOLIC PANEL WITH GFR
Anion gap: 9 (ref 5–15)
BUN: 10 mg/dL (ref 8–23)
CO2: 24 mmol/L (ref 22–32)
Calcium: 8 mg/dL — ABNORMAL LOW (ref 8.9–10.3)
Chloride: 106 mmol/L (ref 98–111)
Creatinine, Ser: 0.7 mg/dL (ref 0.61–1.24)
GFR, Estimated: >60 mL/min (ref >60)
Glucose, Bld: 194 mg/dL — ABNORMAL HIGH (ref 70–99)
Potassium: 4.1 mmol/L (ref 3.5–5.1)
Sodium: 138 mmol/L (ref 135–145)

## 2024-02-16 LAB — MAGNESIUM: Magnesium: 2 mg/dL (ref 1.7–2.4)

## 2024-02-16 SURGERY — EGD (ESOPHAGOGASTRODUODENOSCOPY)
Anesthesia: General

## 2024-02-16 MED ORDER — ACETAMINOPHEN 650 MG RE SUPP: 650.0000 mg | Freq: Four times a day (QID) | RECTAL | Status: DC | PRN

## 2024-02-16 MED ORDER — LIDOCAINE HCL (CARDIAC) PF 100 MG/5ML IV SOSY
PREFILLED_SYRINGE | INTRAVENOUS | Status: DC | PRN
  Administered 2024-02-16: 80 mg via INTRAVENOUS

## 2024-02-16 MED ORDER — ACETAMINOPHEN 500 MG PO TABS
1000.0000 mg | ORAL_TABLET | Freq: Four times a day (QID) | ORAL | Status: DC | PRN
  Administered 2024-02-16: 1000 mg via ORAL
  Filled 2024-02-16: qty 2

## 2024-02-16 MED ORDER — PROPOFOL 500 MG/50ML IV EMUL
INTRAVENOUS | Status: DC | PRN
Start: 2024-02-16 — End: 2024-02-16
  Administered 2024-02-16: 75 ug/kg/min via INTRAVENOUS

## 2024-02-16 MED ORDER — PROPOFOL 10 MG/ML IV BOLUS
INTRAVENOUS | Status: DC | PRN
Start: 2024-02-16 — End: 2024-02-16
  Administered 2024-02-16 (×2): 50 mg via INTRAVENOUS

## 2024-02-16 MED ORDER — GLYCOPYRROLATE 0.2 MG/ML IJ SOLN
INTRAMUSCULAR | Status: DC | PRN
  Administered 2024-02-16: .2 mg via INTRAVENOUS

## 2024-02-16 MED ORDER — FERROUS SULFATE 325 (65 FE) MG PO TBEC: 325.0000 mg | DELAYED_RELEASE_TABLET | Freq: Every day | ORAL | 0 refills | Status: AC

## 2024-02-16 MED ORDER — GLYCOPYRROLATE 0.2 MG/ML IJ SOLN
INTRAMUSCULAR | Status: AC
  Filled 2024-02-16: qty 1

## 2024-02-16 MED ORDER — DEXMEDETOMIDINE HCL IN NACL 80 MCG/20ML IV SOLN
INTRAVENOUS | Status: DC | PRN
  Administered 2024-02-16: 12 ug via INTRAVENOUS
  Administered 2024-02-16: 8 ug via INTRAVENOUS

## 2024-02-16 MED ORDER — SODIUM CHLORIDE 0.9 % IV SOLN: INTRAVENOUS | Status: DC

## 2024-02-16 MED ORDER — EPHEDRINE SULFATE-NACL 50-0.9 MG/10ML-% IV SOSY
PREFILLED_SYRINGE | INTRAVENOUS | Status: DC | PRN
  Administered 2024-02-16: 15 mg via INTRAVENOUS
  Administered 2024-02-16: 10 mg via INTRAVENOUS

## 2024-02-16 MED ORDER — EPHEDRINE 5 MG/ML INJ
INTRAVENOUS | Status: AC
  Filled 2024-02-16: qty 5

## 2024-02-16 NOTE — Op Note (Signed)
 [facility]astroenter[facility] Patient Name: Dillon Henry Procedure Date: 02/16/2024 10:55 AM MRN: [id number] Account #: [id number] Date of Birth: [birth year] Admit Type: Inpatient Age: 73 Room: [facility] ENDO ROOM 4 Gender: Male Note Status: Finalized Instrument Name: Upper GI Scope [id number] Procedure:             Upper GI endoscopy Indications:           Acute post hemorrhagic anemia Providers:             Rogelia Copping MD, MD Referring MD:          No Local Md, MD (Referring MD) Medicines:             Propofol  per Anesthesia Complications:         No immediate complications. Procedure:             Pre-Anesthesia Assessment:                        - Prior to the procedure, a History and Physical was                         performed, and patient medications and allergies were                         reviewed. The patient's tolerance of previous                         anesthesia was also reviewed. The risks and benefits                         of the procedure and the sedation options and risks                         were discussed with the patient. All questions were                         answered, and informed consent was obtained. Prior                         Anticoagulants: The patient has taken no anticoagulant                         or antiplatelet agents. ASA Grade Assessment: II - A                         patient with mild systemic disease. After reviewing                         the risks and benefits, the patient was deemed in                         satisfactory condition to undergo the procedure.                        After obtaining informed consent, the endoscope was                         passed under direct vision. Throughout the procedure,  the patient's blood pressure, pulse, and oxygen                         saturations were monitored continuously. The Endoscope                         was introduced through the  mouth, and advanced to the                         third part of duodenum. The upper GI endoscopy was                         accomplished without difficulty. The patient tolerated                         the procedure well. Findings:      A small hiatal hernia was present.      The entire examined stomach was normal.      Localized mild inflammation characterized by erythema was found in the       duodenal bulb. Impression:            - Small hiatal hernia.                        - Normal stomach.                        - Duodenitis.                        - No specimens collected. Recommendation:        - Return patient to hospital ward for ongoing care.                        - Resume previous diet.                        - Continue present medications.                        - Perform a colonoscopy today. Procedure Code(s):     --- Professional ---                        [phone], Esophagogastroduodenoscopy, flexible,                         transoral; diagnostic, including collection of                         specimen(s) by brushing or washing, when performed                         (separate procedure) Diagnosis Code(s):     --- Professional ---                        D62, Acute posthemorrhagic anemia                        K29.80, Duodenitis without bleeding CPT copyright 2022 American Medical Association. All rights reserved. The codes documented in this report are  preliminary and upon coder review may  be revised to meet current compliance requirements. Rogelia Copping MD, MD 02/16/2024 11:09:08 AM This report has been signed electronically. Number of Addenda: 0 Note Initiated On: 02/16/2024 10:55 AM Estimated Blood Loss:  Estimated blood loss: none.      [facility]

## 2024-02-16 NOTE — Discharge Summary (Addendum)
 DISCHARGE SUMMARY    Dillon Henry FMW:[id number] DOB: [birth year] DOA: 02/14/2024  PCP: Alla Amis, MD  Admit date: 02/14/2024 Discharge date: 02/16/2024   Recommendations for Outpatient Follow-up:  Follow up with PCP in 1-2 weeks to recheck CBC, follow-up on hospitalization, chronic condition management Follow-up with GI to receive pathology results    Hospital Course: Dillon Henry is a 73 year old male with type 2 diabetes, hypertension, dyslipidemia, diverticulosis, osteoarthritis, Charcot's arthropathy, who presents to the ED with bright red bleeding per rectum.  He was admitted, GI was consulted.  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 He underwent EGD and colonoscopy which revealed no active bleed.  One 4 mm polyp in the cecum was removed with cold snare.  This was presumed to be a diverticular bleed.  Bleeding was self-limited.  Despite gradual hemoglobin downtrend patient remained hemodynamically stable.  His bleeding has now stopped, and he is ready for discharge home. He will be initiated on ferrous sulfate daily with plans to follow-up with PCP in the clinic to recheck hemoglobin and ensure stability.  He was advised that if he starts bleeding again to return emergently to the ED.  We will hold aspirin  x 1 week, otherwise no significant medication changes made.  Lower GI bleed Acute blood loss anemia - Presumed diverticular bleed - No acute bleed on colonoscopy.  4 mm polyp in cecum removed - EGD unremarkable.  Revealed small hiatal hernia duodenitis.  No biopsies taken.  Can take as needed Pepcid .  - Hemoglobin downtrending, 8.3 this morning.  Hemodynamically stable -  Patient anxious for discharge - Ferrous sulfate Rx at DC - Advised to avoid constipation.  MiraLAX  daily - Return to ED if begin bleeding again - Hold aspirin  x 1 week - Follow-up with PCP to recheck hemoglobin and ensure stability  Celiac artery stenosis Stenosis of IMA - Incidentally seen on CT bleeding scan - Already on statin and aspirin  - Continue outpatient follow-up with PCP  Hepatic steatosis BMI 29 - Has multiple risk factors including diabetes, overweight, hyperlipidemia, hypertension. - Outpatient follow up for lifestyle modification and risk factor management   Chronic conditions: Type 2 diabetes Hypertension Dyslipidemia Diverticulosis Osteoarthritis Charcot's arthropathy - Continue home meds as prescribed   Discharge Instructions  Discharge Instructions     Call MD for:  difficulty breathing, headache or visual disturbances   Complete by: As directed    Call MD for:  persistant dizziness or light-headedness   Complete by: As directed    Call MD for:  persistant nausea and vomiting   Complete by: As directed    Call MD for:  severe uncontrolled pain   Complete by: As directed    Call MD for:  temperature >100.4   Complete by: As directed    Diet general   Complete by: As directed    Discharge instructions   Complete by: As directed    As we discussed, your bleeding has resolved.  If you begin bleeding again or you are feeling lightheaded or dizzy please emergently return to the ER.  Please hold your aspirin  for 1 week.  You have been prescribed ferrous sulfate, which is iron, to take daily.  This medication can be constipating.  You should avoid constipation to avoid worsening diverticulosis.  Take MiraLAX  daily or twice daily to ensure you are having at least 1 regular not straining bowel movement each day.  Follow-up with your PCP within 2 weeks to recheck your CBC and ensure your hemoglobin is stable and rising   Increase activity slowly   Complete  by: As directed       Allergies as of 02/16/2024       Reactions   Lisinopril Cough   Pioglitazone Other (See Comments)   TOOTH PAIN   Rosiglitazone Other (See Comments)   WEIGHT GAIN        Medication List     PAUSE taking these medications    aspirin  EC 81 MG tablet Wait to take this until: February 23, 2024 Take 81 mg by mouth daily.       STOP taking these medications    enoxaparin  40 MG/0.4ML injection Commonly known as: LOVENOX    HYDROcodone -acetaminophen  5-325 MG tablet Commonly known as: NORCO/VICODIN       TAKE these medications    acetaminophen  500 MG tablet Commonly known as: TYLENOL  Take 1,000 mg by mouth every 8 (eight) hours.   amLODipine  5 MG tablet Commonly known as: NORVASC  Take 5 mg by mouth at bedtime.   ferrous sulfate 325 (65 FE) MG EC tablet Take 1 tablet (325 mg total) by mouth daily with breakfast.   gabapentin  300 MG capsule Commonly known as: NEURONTIN  Take 300 mg by mouth at bedtime.   glipiZIDE  10 MG tablet Commonly known as: GLUCOTROL  Take 10 mg by mouth daily before breakfast.   hydrochlorothiazide  25 MG tablet Commonly known as: HYDRODIURIL  Take 25 mg by mouth daily.   insulin  regular 100 units/mL injection Commonly known as: NOVOLIN R Inject 5-12 Units into the skin 3 (three) times daily as needed for high blood sugar.   Lantus 100 UNIT/ML injection Generic drug: insulin  glargine Inject 60 Units into the skin daily.   LIQUID TEARS OP Place 1 drop into both eyes daily as needed (dryness/irritation).   losartan  50 MG tablet Commonly known as: COZAAR  Take 50 mg by mouth every morning.   magnesium  oxide 400 MG tablet Commonly known as: MAG-OX Take 400 mg by mouth every evening.   metFORMIN  1000 MG tablet Commonly known as: GLUCOPHAGE  Take 1,000 mg by mouth 2 (two) times daily with a meal.   pravastatin  20 MG tablet Commonly known as: PRAVACHOL  Take 20 mg by mouth at bedtime.   zinc  gluconate 50 MG  tablet Take 50 mg by mouth daily.        Follow-up Information     Schedule an appointment as soon as possible for a visit  with Alla Amis, MD.   Specialty: Family Medicine Contact information: [phone] [facility] [place] [address] [phone]         Go to  [facility] Emergency Department at [facility].   Specialty: Emergency Medicine Why: As needed, If symptoms worsen Contact information: [address] [place]  [address] [phone]               Allergies  Allergen Reactions   Lisinopril Cough   Pioglitazone Other (See Comments)    TOOTH PAIN   Rosiglitazone Other (See Comments)    WEIGHT GAIN    Consultations: Treatment Team:  Unk Corinn Skiff, MD Jinny Carmine, MD   Procedures/Studies: CT ANGIO GI BLEED Result Date: 02/15/2024 EXAM: CTA ABDOMEN AND PELVIS WITH CONTRAST 02/15/2024 03:25:45 AM TECHNIQUE: CTA images of the abdomen  and pelvis with intravenous contrast. Three-dimensional MIP/volume rendered formations were performed. Automated exposure control, iterative reconstruction, and/or weight based adjustment of the mA/kV was utilized to reduce the radiation dose to as low as reasonably achievable. COMPARISON: None available. CLINICAL HISTORY: Rectal bleeding, drop in hemoglobin. FINDINGS: VASCULATURE: GI BLEED: No active extravasation of contrast within the GI tract. AORTA: There are mild to moderate patchy aortic calcific plaques without aneurysm, dissection, stenosis or penetrating ulcer. CELIAC TRUNK: There is compression on the celiac artery origin by the median arcuate ligament of the diaphragm causing 40-50 percent vessel origin stenosis. Remainder of the celiac artery is widely patent. There is moderate patchy mixed plaque in the splenic artery but no branch occlusions. SUPERIOR MESENTERIC ARTERY: No acute finding. No occlusion or significant stenosis. INFERIOR MESENTERIC ARTERY:  There is a moderate soft plaque origin stenosis of the IMA; otherwise, the vessel opacified well. RENAL ARTERIES: 2 arteries supply the right kidney, and a single artery supplies the left kidney. The left renal artery is widely patent. On the right, a small caliber upper pole artery arises first and is clear. The dominant artery arises second and demonstrates 40 percent calcific stenosis proximally. Otherwise it is widely patent. There are no hilar branch occlusions bilaterally. ILIAC ARTERIES: No acute finding. There are mild scattered nonstenotic calcific plaques. No occlusion or significant stenosis. ABDOMEN/PELVIS: LOWER CHEST: Scarring change in the lung bases without infiltrate. The cardiac size is normal. There is a small hiatal hernia. There are coronary artery calcifications. LIVER: The liver is 22 cm in length and mildly steatotic. There is no mass enhancement. GALLBLADDER AND BILE DUCTS: Gallbladder is unremarkable. No biliary ductal dilatation. SPLEEN: The spleen is unremarkable. PANCREAS: The pancreas is unremarkable. ADRENAL GLANDS: Bilateral adrenal glands demonstrate no acute abnormality. There is no adrenal mass. KIDNEYS, URETERS AND BLADDER: No stones in the kidneys or ureters. No hydronephrosis. No perinephric or periureteral stranding. There is no renal mass. The urinary bladder is unremarkable, allowing for partial distention. GI AND BOWEL: The gastric wall and nonopacified small bowel are unremarkable. The appendix is unremarkable. There is scattered fluid in the colon. There is no bowel obstruction. No abnormal bowel wall thickening or distension. REPRODUCTIVE: The prostate is mildly enlarged. Other visualized reproductive organs are unremarkable. PERITONEUM AND RETROPERITONEUM: No ascites or free air. LYMPH NODES: No lymphadenopathy. BONES AND SOFT TISSUES: There is osteopenia, degenerative change of the thoracic and lumbar spine, advanced L4-L5 facet hypertrophy and grade 1 L4-L5  spondylolisthesis. No acute or other significant osseous findings. No acute soft tissue abnormality. IMPRESSION: 1. No active GI bleeding. 2. 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 3. 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 Okay 4. 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 5. Mild hepatic prominence and steatosis. 6. Prostatomegaly. 7. Sigmoid diverticulosis without evidence of diverticulitis. Electronically signed by: Francis Quam MD 02/15/2024 03:54 AM EST RP Workstation: [id number]      Discharge Exam: Vitals:   02/16/24 1158 02/16/24 1252  BP: 113/80 (!) 143/89  Pulse: 91 87  Resp: 16 18  Temp:  (!) 97.5 F (36.4 C)  SpO2: 97% 98%   Vitals:   02/16/24 1127 02/16/24 1148 02/16/24 1158 02/16/24 1252  BP: (!) 98/51 (!) 106/58 113/80 (!) 143/89  Pulse: 92 93 91 87  Resp: 16 16 16 18   Temp: (!) 96.6 F (35.9 C)   (!)  97.5 F (36.4 C)  TempSrc: Temporal     SpO2: 97% 98% 97% 98%  Weight:      Height:        Constitutional:  Normal appearance. Non toxic-appearing.  HENT: Head Normocephalic and atraumatic.  Mucous membranes are moist.  Eyes:  Extraocular intact. Conjunctivae normal.  Cardiovascular: Rate and Rhythm: Normal rate and regular rhythm.  Pulmonary: Non labored, symmetric rise of chest wall.  Skin: warm and dry. not jaundiced.  Neurological: No focal deficit present. alert. Oriented.  Psychiatric: Mood and Affect congruent.    The results of significant diagnostics from this hospitalization (including imaging, microbiology, ancillary and laboratory) are listed below for reference.     Microbiology: No results found for this or any previous visit (from the past 240 hours).   Labs: BNP (last 3 results) No results for input(s): BNP in the last 8760 hours. Basic  Metabolic Panel: Recent Labs  Lab 02/14/24 2158 02/16/24 0455  NA 135 138  K 4.0 4.1  CL 97* 106  CO2 23 24  GLUCOSE 151* 194*  BUN 17 10  CREATININE 0.83 0.70  CALCIUM 9.2 8.0*  MG  --  2.0   Liver Function Tests: Recent Labs  Lab 02/14/24 2158  AST 22  ALT 21  ALKPHOS 58  BILITOT 0.6  PROT 6.5  ALBUMIN 4.2   No results for input(s): LIPASE, AMYLASE in the last 168 hours. No results for input(s): AMMONIA in the last 168 hours. CBC: Recent Labs  Lab 02/14/24 2158 02/15/24 0206 02/15/24 0542 02/15/24 1400 02/16/24 0455  WBC 10.3 11.0* 11.6*  --  6.1  NEUTROABS 4.6 7.2  --   --   --   HGB 12.6* 10.6* 10.4* 9.4* 8.3*  HCT 37.9* 31.9* 31.7* 28.4* 24.8*  MCV 89.8 89.9 90.1  --  89.5  PLT 255 253 253  --  171   Cardiac Enzymes: No results for input(s): CKTOTAL, CKMB, CKMBINDEX, TROPONINI in the last 168 hours. BNP: Invalid input(s): POCBNP CBG: Recent Labs  Lab 02/15/24 0035 02/15/24 0556 02/16/24 1023  GLUCAP 171* 210* 211*   D-Dimer No results for input(s): DDIMER in the last 72 hours. Hgb A1c No results for input(s): HGBA1C in the last 72 hours. Lipid Profile No results for input(s): CHOL, HDL, LDLCALC, TRIG, CHOLHDL, LDLDIRECT in the last 72 hours. Thyroid function studies No results for input(s): TSH, T4TOTAL, T3FREE, THYROIDAB in the last 72 hours.  Invalid input(s): FREET3 Anemia work up No results for input(s): VITAMINB12, FOLATE, FERRITIN, TIBC, IRON, RETICCTPCT in the last 72 hours. Urinalysis    Component Value Date/Time   COLORURINE YELLOW (A) 09/25/2020 1317   APPEARANCEUR CLEAR (A) 09/25/2020 1317   LABSPEC 1.017 09/25/2020 1317   PHURINE 6.0 09/25/2020 1317   GLUCOSEU 50 (A) 09/25/2020 1317   HGBUR NEGATIVE 09/25/2020 1317   BILIRUBINUR NEGATIVE 09/25/2020 1317   KETONESUR NEGATIVE 09/25/2020 1317   PROTEINUR NEGATIVE 09/25/2020 1317   NITRITE NEGATIVE 09/25/2020 1317    LEUKOCYTESUR NEGATIVE 09/25/2020 1317   Sepsis Labs Recent Labs  Lab 02/14/24 2158 02/15/24 0206 02/15/24 0542 02/16/24 0455  WBC 10.3 11.0* 11.6* 6.1   Microbiology No results found for this or any previous visit (from the past 240 hours).   Time coordinating discharge: 32 min   SIGNED: Bryon Parker, DO [facility] 02/16/2024, 2:12 PM Pager   If 7PM-7AM, please contact night-coverage

## 2024-02-16 NOTE — ED Notes (Signed)
Pt refusing to be placed on cardiac monitor.

## 2024-02-16 NOTE — Transfer of Care (Signed)
 Immediate Anesthesia Transfer of Care Note  Patient: Dillon Henry  Procedure(s) Performed: EGD (ESOPHAGOGASTRODUODENOSCOPY) COLONOSCOPY  Patient Location: PACU  Anesthesia Type:General  Level of Consciousness: sedated  Airway & Oxygen Therapy: Patient Spontanous Breathing  Post-op Assessment: Report given to RN and Post -op Vital signs reviewed and stable  Post vital signs: Reviewed and stable  Last Vitals:  Vitals Value Taken Time  BP    Temp    Pulse    Resp    SpO2      Last Pain:  Vitals:   02/16/24 1026  TempSrc: Tympanic  PainSc: 0-No pain      Patients Stated Pain Goal: 0 (02/14/24 2203)  Complications: No notable events documented.

## 2024-02-16 NOTE — Anesthesia Postprocedure Evaluation (Signed)
 Anesthesia Post Note  Patient: Dillon Henry  Procedure(s) Performed: EGD (ESOPHAGOGASTRODUODENOSCOPY) COLONOSCOPY  Patient location during evaluation: PACU Anesthesia Type: General Level of consciousness: awake and alert Pain management: pain level controlled Vital Signs Assessment: post-procedure vital signs reviewed and stable Respiratory status: spontaneous breathing, nonlabored ventilation, respiratory function stable and patient connected to nasal cannula oxygen Cardiovascular status: blood pressure returned to baseline and stable Postop Assessment: no apparent nausea or vomiting Anesthetic complications: no   No notable events documented.   Last Vitals:  Vitals:   02/16/24 1158 02/16/24 1252  BP: 113/80 (!) 143/89  Pulse: 91 87  Resp: 16 18  Temp:  (!) 36.4 C  SpO2: 97% 98%    Last Pain:  Vitals:   02/16/24 1158  TempSrc:   PainSc: 0-No pain                 Shau-Shau Melia

## 2024-02-16 NOTE — Op Note (Signed)
 [facility] Gastroenterology Patient Name: Dillon Henry Procedure Date: 02/16/2024 10:54 AM MRN: [id number] Account #: [id number] Date of Birth: February 04, 1951 Admit Type: Outpatient Age: 73 Room: [facility] ENDO ROOM 4 Gender: Male Note Status: Finalized Instrument Name: Colon Scope [id number] Procedure:             Colonoscopy Indications:           Hematochezia Providers:             Rogelia Copping MD, MD Referring MD:          No Local Md, MD (Referring MD) Medicines:             Propofol  per Anesthesia Complications:         No immediate complications. Procedure:             Pre-Anesthesia Assessment:                        - Prior to the procedure, a History and Physical was                         performed, and patient medications and allergies were                         reviewed. The patient's tolerance of previous                         anesthesia was also reviewed. The risks and benefits                         of the procedure and the sedation options and risks                         were discussed with the patient. All questions were                         answered, and informed consent was obtained. Prior                         Anticoagulants: The patient has taken no anticoagulant                         or antiplatelet agents. ASA Grade Assessment: II - A                         patient with mild systemic disease. After reviewing                         the risks and benefits, the patient was deemed in                         satisfactory condition to undergo the procedure.                        After obtaining informed consent, the colonoscope was                         passed under direct vision. Throughout the procedure,  the patient's blood pressure, pulse, and oxygen                         saturations were monitored continuously. The                         Colonoscope was introduced through the anus and                          advanced to the the terminal ileum. The colonoscopy                         was performed without difficulty. The patient                         tolerated the procedure well. The quality of the bowel                         preparation was good. Findings:      The perianal and digital rectal examinations were normal.      Multiple small-mouthed diverticula were found in the sigmoid colon.      A 4 mm polyp was found in the cecum. The polyp was sessile. The polyp       was removed with a cold snare. Resection and retrieval were complete.      The terminal ileum appeared normal. Impression:            - Diverticulosis in the sigmoid colon.                        - One 4 mm polyp in the cecum, removed with a cold                         snare. Resected and retrieved.                        - The examined portion of the ileum was normal.                        - No fresh or old blood seen.                        Liekly a diverticular bleed.                        If any further bleeding then a Red tagged bleeding scan Recommendation:        - Return patient to hospital ward for ongoing care.                        - Advance diet as tolerated.                        - Continue present medications. Procedure Code(s):     --- Professional ---                        [phone], Colonoscopy, flexible; with removal of  tumor(s), polyp(s), or other lesion(s) by snare                         technique Diagnosis Code(s):     --- Professional ---                        K92.1, Melena (includes Hematochezia)                        D12.0, Benign neoplasm of cecum CPT copyright 2022 American Medical Association. All rights reserved. The codes documented in this report are preliminary and upon coder review may  be revised to meet current compliance requirements. Rogelia Copping MD, MD 02/16/2024 11:24:52 AM This report has been signed electronically. Number of Addenda: 0 Note  Initiated On: 02/16/2024 10:54 AM Scope Withdrawal Time: 0 hours 7 minutes 38 seconds  Total Procedure Duration: 0 hours 11 minutes 10 seconds  Estimated Blood Loss:  Estimated blood loss: none.      [facility]

## 2024-02-16 NOTE — ED Notes (Signed)
 Spoke with Dr Leesa face to face, PO meds are to be given.

## 2024-02-16 NOTE — Anesthesia Preprocedure Evaluation (Addendum)
 Anesthesia Evaluation  Patient identified by MRN, date of birth, ID band Patient awake    Reviewed: Allergy & Precautions, NPO status , Patient's Chart, lab work & pertinent test results  History of Anesthesia Complications Negative for: history of anesthetic complications  Airway Mallampati: III  TM Distance: >3 FB Neck ROM: full    Dental  (+) Chipped, Missing   Pulmonary former smoker   Pulmonary exam normal        Cardiovascular hypertension, On Medications + dysrhythmias (s/p ablation) Atrial Fibrillation      Neuro/Psych  Neuromuscular disease  negative psych ROS   GI/Hepatic negative GI ROS, Neg liver ROS,,,  Endo/Other  diabetes    Renal/GU negative Renal ROS  negative genitourinary   Musculoskeletal   Abdominal   Peds  Hematology  (+) Blood dyscrasia, anemia Hg 8.3 02/16/2024   Anesthesia Other Findings Past Medical History: No date: Arthritis     Comment:  osteoarthritis No date: Charcot's arthropathy 1994: Diabetes mellitus     Comment:  Type II No date: Diabetic retinopathy (HCC) No date: Dysrhythmia     Comment:  AF, history of an ablation in 2005 No date: Hammer toe of left foot 05/1997: Hyperlipidemia 1995: Hypertension No date: Neuromuscular disorder (HCC)     Comment:  polyneuropathies d/t diabetes 01/2020: Osteomyelitis (HCC)     Comment:  left great toe 01/2020: Skin ulcer (HCC)     Comment:  full thickness ulceration over IPJ. 01/2020: Tinnitus     Comment:  bilateral with sensory hearing loss  Past Surgical History: 02/17/2020: AMPUTATION TOE; Left     Comment:  Procedure: AMPUTATION TOE MPJ LEFT;  Surgeon: Neill Boas, DPM;  Location: [facility] ORS;  Service: Podiatry;                Laterality: Left; 2005: ATRIAL ABLATION SURGERY     Comment:  For A Fib 12/23/2016: COLONOSCOPY WITH PROPOFOL ; N/A     Comment:  Procedure: COLONOSCOPY WITH PROPOFOL ;  Surgeon:                Gaylyn Gladis PENNER, MD;  Location: [facility] ENDOSCOPY;                Service: Endoscopy;  Laterality: N/A; 11/26/2005: DOBUTAMINE  STRESS ECHO     Comment:  Nonsust VTach HTSVE ECG NML 1999: DOPPLER ECHOCARDIOGRAPHY     Comment:  Normal, EF 60% 01/11/2001: DOPPLER ECHOCARDIOGRAPHY     Comment:  Mild CMC, LVH, TR MR, TR: ([place]) 06/1998: ETT     Comment:  Normal 05/1998: Exercise tolerance test     Comment:  OK 12/1997: Stress cardilite     Comment:  Normal 03/02/2002: Stress cardiolite     Comment:  WNL  EF 70% No date: TOE AMPUTATION No date: TONSILLECTOMY AND ADENOIDECTOMY 10/09/2020: TOTAL KNEE ARTHROPLASTY; Right     Comment:  Procedure: TOTAL KNEE ARTHROPLASTY - [place] Amber to               Assist;  Surgeon: Kathlynn Sharper, MD;  Location: [facility] ORS;              Service: Orthopedics;  Laterality: Right; No date: VASECTOMY  BMI    Body Mass Index: 28.76 kg/m      Reproductive/Obstetrics negative OB ROS  Anesthesia Physical Anesthesia Plan  ASA: 3  Anesthesia Plan: General   Post-op Pain Management: Minimal or no pain anticipated   Induction: Intravenous  PONV Risk Score and Plan: 1 and Propofol  infusion and TIVA  Airway Management Planned: Natural Airway and Nasal Cannula  Additional Equipment:   Intra-op Plan:   Post-operative Plan:   Informed Consent: I have reviewed the patients History and Physical, chart, labs and discussed the procedure including the risks, benefits and alternatives for the proposed anesthesia with the patient or authorized representative who has indicated his/her understanding and acceptance.     Dental Advisory Given  Plan Discussed with: CRNA  Anesthesia Plan Comments: (Patient consented for risks of anesthesia including but not limited to:  - adverse reactions to medications - risk of airway placement if required - damage to eyes, teeth, lips or other oral mucosa - nerve  damage due to positioning  - sore throat or hoarseness - Damage to heart, brain, nerves, lungs, other parts of body or loss of life  Patient voiced understanding and assent.)        Anesthesia Quick Evaluation

## 2024-02-17 LAB — SURGICAL PATHOLOGY

## 2024-02-18 ENCOUNTER — Ambulatory Visit: Admitting: Occupational Therapy

## 2024-02-19 ENCOUNTER — Ambulatory Visit: Payer: Self-pay | Admitting: Gastroenterology
